# Patient Record
Sex: Male | Born: 1965 | Race: White | Hispanic: No | Marital: Single | Smoking: Current every day smoker
Health system: Southern US, Community
[De-identification: ages and names within clinical notes are randomized; demographics above are authoritative.]

## PROBLEM LIST (undated history)

## (undated) DIAGNOSIS — L0291 Cutaneous abscess, unspecified: Secondary | ICD-10-CM

## (undated) DIAGNOSIS — E119 Type 2 diabetes mellitus without complications: Secondary | ICD-10-CM

## (undated) DIAGNOSIS — M549 Dorsalgia, unspecified: Secondary | ICD-10-CM

## (undated) DIAGNOSIS — I219 Acute myocardial infarction, unspecified: Secondary | ICD-10-CM

## (undated) DIAGNOSIS — M5430 Sciatica, unspecified side: Secondary | ICD-10-CM

## (undated) HISTORY — PX: ROTATOR CUFF REPAIR: SHX139

## (undated) HISTORY — PX: CORONARY STENT PLACEMENT: SHX6853

## (undated) HISTORY — PX: TONSILLECTOMY: SUR1361

---

## 2012-08-13 ENCOUNTER — Encounter (HOSPITAL_BASED_OUTPATIENT_CLINIC_OR_DEPARTMENT_OTHER): Payer: Self-pay | Admitting: Emergency Medicine

## 2012-08-13 ENCOUNTER — Emergency Department (HOSPITAL_BASED_OUTPATIENT_CLINIC_OR_DEPARTMENT_OTHER)
Admission: EM | Admit: 2012-08-13 | Discharge: 2012-08-13 | Disposition: A | Discharge status: Home / Self Care | Payer: Medicaid Other | Attending: Emergency Medicine | Admitting: Emergency Medicine

## 2012-08-13 DIAGNOSIS — M549 Dorsalgia, unspecified: Secondary | ICD-10-CM | POA: Insufficient documentation

## 2012-08-13 DIAGNOSIS — M5416 Radiculopathy, lumbar region: Secondary | ICD-10-CM

## 2012-08-13 MED ORDER — HYDROCODONE-ACETAMINOPHEN 5-325 MG PO TABS
1.0000 | ORAL_TABLET | Freq: Once | ORAL | Status: AC
  Administered 2012-08-13: 1 via ORAL
  Filled 2012-08-13: qty 1

## 2012-08-13 MED ORDER — IBUPROFEN 600 MG PO TABS: 600.0000 mg | ORAL_TABLET | Freq: Three times a day (TID) | ORAL | Status: AC

## 2012-08-13 MED ORDER — KETOROLAC TROMETHAMINE 30 MG/ML IJ SOLN
30.0000 mg | Freq: Once | INTRAMUSCULAR | Status: AC
  Administered 2012-08-13: 30 mg via INTRAMUSCULAR
  Filled 2012-08-13: qty 1

## 2012-08-13 MED ORDER — HYDROCODONE-ACETAMINOPHEN 5-325 MG PO TABS: 2.0000 | ORAL_TABLET | Freq: Three times a day (TID) | ORAL | Status: AC | PRN

## 2012-08-13 MED ORDER — DIAZEPAM 5 MG PO TABS: 5.0000 mg | ORAL_TABLET | Freq: Two times a day (BID) | ORAL | Status: AC

## 2012-08-13 MED ORDER — DIAZEPAM 5 MG PO TABS
5.0000 mg | ORAL_TABLET | Freq: Once | ORAL | Status: AC
  Administered 2012-08-13: 5 mg via ORAL
  Filled 2012-08-13: qty 1

## 2012-08-13 NOTE — ED Notes (Signed)
Pt states that his back pain has been going for about 6 months. He states the pain radiate to his both legs.

## 2012-08-13 NOTE — ED Provider Notes (Signed)
History     CSN: [id number]  Arrival date & time 08/13/12  1024   First MD Initiated Contact with Patient 08/13/12 1059      Chief Complaint  Patient presents with  . Back Pain  . Leg Pain     HPI  the patient presents with concerns of worsening lower 70 and low back pain.  He notes a history of months of intermittent pain, prominently about the left lower back with radiation down the posterior of the left leg.  Over the past month this has become more persistent, severe.  The pain is sharp, burning, worse with weightbearing or motion.  There is minimal crossover to the right side, but this is increasing in frequency in the past days.  He denies distal dysesthesia or weakness.  No recent trauma, falls, illness. No relief with ibuprofen, ice packs, muscle relaxants History reviewed. No pertinent past medical history.  History reviewed. No pertinent past surgical history.  History reviewed. No pertinent family history.  History  Substance Use Topics  . Smoking status: Never Smoker   . Smokeless tobacco: Not on file  . Alcohol Use: 0.0 oz/week     occ      Review of Systems  Constitutional:       Per HPI, otherwise negative  HENT:       Per HPI, otherwise negative  Eyes: Negative.   Respiratory:       Per HPI, otherwise negative  Cardiovascular:       Per HPI, otherwise negative  Gastrointestinal: Negative for vomiting.  Genitourinary: Negative.   Musculoskeletal:       Per HPI, otherwise negative  Skin: Negative.   Neurological: Negative for syncope.    Allergies  Review of patient's allergies indicates no known allergies.  Home Medications  No current outpatient prescriptions on file.  BP 130/88  Pulse 66  Temp 98.4 F (36.9 C) (Oral)  Resp 18  Ht 5\' 10"  (1.778 m)  Wt 225 lb (102.059 kg)  BMI 32.28 kg/m2  SpO2 100%  Physical Exam  Nursing note and vitals reviewed. Constitutional: He is oriented to person, place, and time. He appears well-developed.  No distress.  HENT:  Head: Normocephalic and atraumatic.  Eyes: Conjunctivae and EOM are normal.  Cardiovascular: Normal rate and regular rhythm.   Pulmonary/Chest: Effort normal. No stridor. No respiratory distress.  Abdominal: He exhibits no distension.  Musculoskeletal: He exhibits no edema.       Arms:      The patient is 5/5 strength in both lower terminates, symmetric.  There is no limitation of the range of motion, distal pulses are appropriate as is distal sensation  Neurological: He is alert and oriented to person, place, and time.  Skin: Skin is warm and dry.  Psychiatric: He has a normal mood and affect.    ED Course  Procedures (including critical care time)  Labs Reviewed - No data to display No results found.   No diagnosis found.    MDM  This patient presenting with back pain, now worsening and more prominent is in no distress on my exam.  Patient has no focal neurovascular deficits, and his description of pain, worse with motion or weight-bearing, as well as his occupation that requires significant physical exertion suggestive of radiculopathy versus muscle strain as a likely etiology.  Discussed, at length, with the patient and his wife for continuing monitoring, specifically with a primary care physician and possibly an orthopedist.  We also discussed  adjunct therapy such as chiropractic care, new footwear.  The patient was discharged in stable condition.    Gerhard Munch, MD 08/13/12 [phone]

## 2012-09-02 ENCOUNTER — Encounter (HOSPITAL_BASED_OUTPATIENT_CLINIC_OR_DEPARTMENT_OTHER): Payer: Self-pay | Admitting: Emergency Medicine

## 2012-09-02 ENCOUNTER — Emergency Department (HOSPITAL_BASED_OUTPATIENT_CLINIC_OR_DEPARTMENT_OTHER)
Admission: EM | Admit: 2012-09-02 | Discharge: 2012-09-02 | Disposition: A | Discharge status: Home / Self Care | Payer: Medicaid Other | Attending: Emergency Medicine | Admitting: Emergency Medicine

## 2012-09-02 DIAGNOSIS — M545 Low back pain, unspecified: Secondary | ICD-10-CM | POA: Insufficient documentation

## 2012-09-02 DIAGNOSIS — G8929 Other chronic pain: Secondary | ICD-10-CM

## 2012-09-02 DIAGNOSIS — X500XXA Overexertion from strenuous movement or load, initial encounter: Secondary | ICD-10-CM | POA: Insufficient documentation

## 2012-09-02 MED ORDER — IBUPROFEN 800 MG PO TABS: 800.0000 mg | ORAL_TABLET | Freq: Three times a day (TID) | ORAL | Status: DC

## 2012-09-02 MED ORDER — PREDNISONE 10 MG PO TABS: ORAL_TABLET | ORAL | Status: DC

## 2012-09-02 MED ORDER — CYCLOBENZAPRINE HCL 10 MG PO TABS: 10.0000 mg | ORAL_TABLET | Freq: Two times a day (BID) | ORAL | Status: DC | PRN

## 2012-09-02 NOTE — ED Notes (Signed)
C/o of back pain with radiation to lt leg x 6 months. Seen here But no relief since- unable to make an appt with a PMD

## 2012-09-02 NOTE — ED Provider Notes (Signed)
History     CSN: [id number]  Arrival date & time 09/02/12  1137   First MD Initiated Contact with Patient 09/02/12 1206      Chief Complaint  Patient presents with  . Back Pain    (Consider location/radiation/quality/duration/timing/severity/associated sxs/prior treatment) Patient is a 46 y.o. male presenting with back pain. The history is provided by the patient. No language interpreter was used.  Back Pain  This is a chronic problem. The current episode started more than 1 week ago (6 months ago). The problem occurs constantly. The problem has been gradually worsening. The pain is associated with lifting heavy objects. The pain is present in the lumbar spine. The quality of the pain is described as burning and aching. The pain radiates to the left thigh. The pain is at a severity of 7/10. The pain is moderate. The symptoms are aggravated by bending, twisting and certain positions. The pain is worse during the day. Stiffness is present all day. Associated symptoms include numbness and leg pain. Pertinent negatives include no chest pain, no fever, no weight loss, no headaches, no abdominal pain, no abdominal swelling, no bowel incontinence, no perianal numbness, no bladder incontinence, no dysuria, no pelvic pain, no paresthesias, no paresis, no tingling and no weakness. He has tried NSAIDs for the symptoms. The treatment provided mild relief.    History reviewed. No pertinent past medical history.  History reviewed. No pertinent past surgical history.  No family history on file.  History  Substance Use Topics  . Smoking status: Never Smoker   . Smokeless tobacco: Not on file  . Alcohol Use: 0.0 oz/week     occ      Review of Systems  Constitutional: Negative for fever and weight loss.  HENT: Negative.   Eyes: Negative.   Respiratory: Negative.   Cardiovascular: Negative for chest pain.  Gastrointestinal: Negative.  Negative for abdominal pain and bowel incontinence.    Genitourinary: Negative.  Negative for bladder incontinence, dysuria and pelvic pain.  Musculoskeletal: Positive for back pain.  Skin: Negative.   Neurological: Positive for numbness. Negative for tingling, weakness, headaches and paresthesias.  Hematological: Negative.   Psychiatric/Behavioral: Negative.   All other systems reviewed and are negative.    Allergies  Review of patient's allergies indicates no known allergies.  Home Medications  No current outpatient prescriptions on file.  BP 131/83  Pulse 76  Temp 97.4 F (36.3 C) (Oral)  Resp 20  SpO2 99%  Physical Exam  Nursing note and vitals reviewed. Constitutional: He is oriented to person, place, and time. He appears well-developed and well-nourished.  HENT:  Head: Normocephalic.  Eyes: Pupils are equal, round, and reactive to light.  Neck: Normal range of motion.  Cardiovascular: Normal rate, regular rhythm and normal heart sounds.   Pulmonary/Chest: Effort normal and breath sounds normal.  Abdominal: Soft. Bowel sounds are normal.  Musculoskeletal:       Left low back pain with palpation over the left SI and buttock, ROM 5/5, Strength 4/5, reflexes 2+  Neurological: He is alert and oriented to person, place, and time.  Skin: Skin is warm and dry.  Psychiatric: He has a normal mood and affect. His behavior is normal.    ED Course  Procedures (including critical care time)  Labs Reviewed - No data to display No results found.   1. Chronic back pain       MDM  Patient with chronic back pain, possibly sciatica, will treat with steroids and refer  to PCP for outpatient PT referral.        Roxy Horseman, PA-C 09/02/12 1237  Roxy Horseman, PA-C 09/02/12 1242

## 2012-09-03 NOTE — ED Provider Notes (Signed)
Medical screening examination/treatment/procedure(s) were performed by non-physician practitioner and as supervising physician I was immediately available for consultation/collaboration.  Michale Emmerich, MD 09/03/12 0704 

## 2014-07-06 DIAGNOSIS — M542 Cervicalgia: Secondary | ICD-10-CM | POA: Insufficient documentation

## 2014-07-06 DIAGNOSIS — M545 Low back pain, unspecified: Secondary | ICD-10-CM | POA: Insufficient documentation

## 2014-08-17 DIAGNOSIS — M5136 Other intervertebral disc degeneration, lumbar region: Secondary | ICD-10-CM | POA: Insufficient documentation

## 2015-01-17 DIAGNOSIS — N486 Induration penis plastica: Secondary | ICD-10-CM | POA: Insufficient documentation

## 2015-08-30 DIAGNOSIS — F419 Anxiety disorder, unspecified: Secondary | ICD-10-CM | POA: Insufficient documentation

## 2016-08-01 ENCOUNTER — Emergency Department (HOSPITAL_BASED_OUTPATIENT_CLINIC_OR_DEPARTMENT_OTHER)
Admission: EM | Admit: 2016-08-01 | Discharge: 2016-08-01 | Disposition: A | Discharge status: Home / Self Care | Payer: Medicaid Other | Attending: Emergency Medicine | Admitting: Emergency Medicine

## 2016-08-01 ENCOUNTER — Encounter (HOSPITAL_BASED_OUTPATIENT_CLINIC_OR_DEPARTMENT_OTHER): Payer: Self-pay

## 2016-08-01 DIAGNOSIS — Z79899 Other long term (current) drug therapy: Secondary | ICD-10-CM | POA: Diagnosis not present

## 2016-08-01 DIAGNOSIS — H6093 Unspecified otitis externa, bilateral: Secondary | ICD-10-CM | POA: Diagnosis not present

## 2016-08-01 DIAGNOSIS — H9201 Otalgia, right ear: Secondary | ICD-10-CM | POA: Diagnosis present

## 2016-08-01 HISTORY — DX: Sciatica, unspecified side: M54.30

## 2016-08-01 HISTORY — DX: Dorsalgia, unspecified: M54.9

## 2016-08-01 MED ORDER — NEOMYCIN-POLYMYXIN-HC 3.5-10000-1 OT SUSP: 5.0000 [drp] | Freq: Four times a day (QID) | OTIC | 0 refills | Status: AC

## 2016-08-01 NOTE — ED Provider Notes (Signed)
[facility]   CSN: [id number] Arrival date & time: 08/01/16  1612  By signing my name below, I, Tony Patel, attest that this documentation has been prepared under the direction and in the presence of Tony Joy, PA-C. Electronically Signed: Angelene GiovanniEmmanuella Patel, ED Scribe. 08/01/16. 4:36 PM.    History   Chief Complaint Chief Complaint  Patient presents with  . Otalgia    HPI Comments: Tony SaxonCurtis Ehrler Jr. is a 50 y.o. male who presents to the Emergency Department complaining of ongoing moderate bilateral otalgia, worse on the right onset several days ago. He explains that he has been to the pool a lot lately. No alleviating factors noted. Patient states, "It feels like I have swimmer's ear." Pt has not tried any medications PTA. He denies any hearing changes, fever, chills, generalized rash, or any other complaints.    The history is provided by the patient. No language interpreter was used.    Past Medical History:  Diagnosis Date  . Back pain   . Sciatica     There are no active problems to display for this patient.   History reviewed. No pertinent surgical history.     Home Medications    Prior to Admission medications   Medication Sig Start Date End Date Taking? Authorizing Provider  HYDROcodone-acetaminophen (NORCO) 7.5-325 MG tablet Take 1 tablet by mouth every 6 (six) hours as needed for moderate pain.   Yes Historical Provider, MD  ibuprofen (ADVIL,MOTRIN) 800 MG tablet Take 1 tablet (800 mg total) by mouth 3 (three) times daily. 09/02/12   Roxy Horsemanobert Browning, PA-C  neomycin-polymyxin-hydrocortisone (CORTISPORIN) 3.5-10000-1 otic suspension Place 5 drops into both ears 4 (four) times daily. 08/01/16 08/08/16  Anselm PancoastShawn C Joy, PA-C    Family History No family history on file.  Social History Social History  Substance Use Topics  . Smoking status: Never Smoker  . Smokeless tobacco: Never Used  . Alcohol use 0.0 oz/week     Comment: daily       Allergies   Review of patient's allergies indicates no known allergies.   Review of Systems Review of Systems  Constitutional: Negative for chills and fever.  HENT: Positive for ear pain. Negative for ear discharge and hearing loss.   Skin: Negative for rash.     Physical Exam Updated Vital Signs BP 129/93   Pulse 79   Temp 98.1 F (36.7 C)   Resp 16   Ht 5\' 10"  (1.778 m)   Wt 206 lb (93.4 kg)   SpO2 96%   BMI 29.56 kg/m   Physical Exam  Constitutional: He appears well-developed and well-nourished. No distress.  HENT:  Head: Normocephalic and atraumatic.  Both ear canals erythematous with distribution of white debris, more in the right than the left. Some cerumen buildup bilaterally. Right ear: pain with manipulation of the auricle and tragus.   Eyes: Conjunctivae are normal.  Neck: Neck supple.  Cardiovascular: Normal rate and regular rhythm.   Pulmonary/Chest: Effort normal.  Neurological: He is alert.  Skin: Skin is warm and dry. He is not diaphoretic.  Psychiatric: He has a normal mood and affect. His behavior is normal.  Nursing note and vitals reviewed.    ED Treatments / Results  DIAGNOSTIC STUDIES: Oxygen Saturation is 96% on RA, adequate by my interpretation.    COORDINATION OF CARE: 4:28 PM- Pt advised of plan for treatment and pt agrees. Pt will receive for ear drops for pain relief.   Procedures Procedures (including  critical care time)  Medications Ordered in ED Medications - No data to display   Initial Impression / Assessment and Plan / ED Course  Harolyn RutherfordShawn Joy, PA-C has reviewed the triage vital signs and the nursing notes.  Pertinent labs & imaging results that were available during my care of the patient were reviewed by me and considered in my medical decision making (see chart for details).  Clinical Course    Tony SaxonCurtis Bienaime Jr. presents with otalgia for the last several days.  Suspect otitis externa. No systemic symptoms.  Prescription given. The patient was given instructions for home care as well as return precautions. Patient voices understanding of these instructions, accepts the plan, and is comfortable with discharge.   Final Clinical Impressions(s) / ED Diagnoses   Final diagnoses:  Otitis externa, bilateral   I personally performed the services described in this documentation, which was scribed in my presence. The recorded information has been reviewed and is accurate.    New Prescriptions New Prescriptions   NEOMYCIN-POLYMYXIN-HYDROCORTISONE (CORTISPORIN) 3.5-10000-1 OTIC SUSPENSION    Place 5 drops into both ears 4 (four) times daily.     Anselm PancoastShawn C Joy, PA-C 08/01/16 1645    Tony MemosJason Mesner, MD 08/01/16 2053

## 2016-08-01 NOTE — ED Triage Notes (Signed)
Right earache x 3 days-NAD-steady gait

## 2016-08-01 NOTE — ED Notes (Signed)
PA at bedside.

## 2016-08-01 NOTE — Discharge Instructions (Signed)
You have been seen today for ear pain. The physical exam findings are consistent with otitis externa, also known as swimmer's ear. Keep the ears clean and dry. After swimming, be sure to dry the ears completely and soon after you leave the water. Do not stick anything into the ears. Refer to the attached instructions for more detailed information. You will also see instructions regarding earwax buildup. You do not have a cerumen impaction at this time, rather this information sheet is included to help you with care for wax buildup. Follow up with PCP as needed should symptoms continue. Return to ED should symptoms worsen.

## 2016-10-10 ENCOUNTER — Emergency Department (HOSPITAL_BASED_OUTPATIENT_CLINIC_OR_DEPARTMENT_OTHER): Payer: Medicaid Other

## 2016-10-10 ENCOUNTER — Emergency Department (HOSPITAL_BASED_OUTPATIENT_CLINIC_OR_DEPARTMENT_OTHER)
Admission: EM | Admit: 2016-10-10 | Discharge: 2016-10-10 | Disposition: A | Discharge status: Home / Self Care | Payer: Medicaid Other | Attending: Emergency Medicine | Admitting: Emergency Medicine

## 2016-10-10 ENCOUNTER — Encounter (HOSPITAL_BASED_OUTPATIENT_CLINIC_OR_DEPARTMENT_OTHER): Payer: Self-pay

## 2016-10-10 DIAGNOSIS — S62610A Displaced fracture of proximal phalanx of right index finger, initial encounter for closed fracture: Secondary | ICD-10-CM | POA: Insufficient documentation

## 2016-10-10 DIAGNOSIS — Y939 Activity, unspecified: Secondary | ICD-10-CM | POA: Insufficient documentation

## 2016-10-10 DIAGNOSIS — Y999 Unspecified external cause status: Secondary | ICD-10-CM | POA: Diagnosis not present

## 2016-10-10 DIAGNOSIS — W230XXA Caught, crushed, jammed, or pinched between moving objects, initial encounter: Secondary | ICD-10-CM | POA: Diagnosis not present

## 2016-10-10 DIAGNOSIS — S6991XA Unspecified injury of right wrist, hand and finger(s), initial encounter: Secondary | ICD-10-CM | POA: Diagnosis present

## 2016-10-10 DIAGNOSIS — F17200 Nicotine dependence, unspecified, uncomplicated: Secondary | ICD-10-CM | POA: Insufficient documentation

## 2016-10-10 DIAGNOSIS — Y929 Unspecified place or not applicable: Secondary | ICD-10-CM | POA: Insufficient documentation

## 2016-10-10 DIAGNOSIS — S62619A Displaced fracture of proximal phalanx of unspecified finger, initial encounter for closed fracture: Secondary | ICD-10-CM

## 2016-10-10 MED ORDER — TRAMADOL HCL 50 MG PO TABS: 50.0000 mg | ORAL_TABLET | Freq: Four times a day (QID) | ORAL | 0 refills | Status: DC | PRN

## 2016-10-10 MED ORDER — IBUPROFEN 800 MG PO TABS: 800.0000 mg | ORAL_TABLET | Freq: Three times a day (TID) | ORAL | 0 refills | Status: DC | PRN

## 2016-10-10 MED FILL — IBUPROFEN 800 MG TABLET: 800 | 7 days supply | Qty: 21 | Fill #0
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
## 2016-10-10 NOTE — Discharge Instructions (Signed)
Return here as needed. Follow up with the hand surgeon provided.  °

## 2016-10-10 NOTE — ED Notes (Signed)
PA at bedside.

## 2017-01-28 ENCOUNTER — Emergency Department (HOSPITAL_BASED_OUTPATIENT_CLINIC_OR_DEPARTMENT_OTHER)
Admission: EM | Admit: 2017-01-28 | Discharge: 2017-01-28 | Disposition: A | Discharge status: Home / Self Care | Payer: Medicaid Other | Attending: Emergency Medicine | Admitting: Emergency Medicine

## 2017-01-28 ENCOUNTER — Encounter (HOSPITAL_BASED_OUTPATIENT_CLINIC_OR_DEPARTMENT_OTHER): Payer: Self-pay | Admitting: Emergency Medicine

## 2017-01-28 DIAGNOSIS — L0231 Cutaneous abscess of buttock: Secondary | ICD-10-CM | POA: Insufficient documentation

## 2017-01-28 DIAGNOSIS — F17200 Nicotine dependence, unspecified, uncomplicated: Secondary | ICD-10-CM | POA: Diagnosis not present

## 2017-01-28 DIAGNOSIS — E119 Type 2 diabetes mellitus without complications: Secondary | ICD-10-CM | POA: Diagnosis not present

## 2017-01-28 DIAGNOSIS — Z7984 Long term (current) use of oral hypoglycemic drugs: Secondary | ICD-10-CM | POA: Insufficient documentation

## 2017-01-28 DIAGNOSIS — L0291 Cutaneous abscess, unspecified: Secondary | ICD-10-CM

## 2017-01-28 HISTORY — DX: Type 2 diabetes mellitus without complications: E11.9

## 2017-01-28 MED ORDER — DOXYCYCLINE HYCLATE 100 MG PO CAPS: 100.0000 mg | ORAL_CAPSULE | Freq: Two times a day (BID) | ORAL | 0 refills | Status: DC

## 2017-01-28 MED ORDER — IBUPROFEN 800 MG PO TABS
800.0000 mg | ORAL_TABLET | Freq: Once | ORAL | Status: AC
  Administered 2017-01-28: 800 mg via ORAL
  Filled 2017-01-28: qty 1

## 2017-01-28 MED ORDER — LIDOCAINE-EPINEPHRINE 1 %-1:100000 IJ SOLN
INTRAMUSCULAR | Status: AC
  Administered 2017-01-28: 1 mL
  Filled 2017-01-28: qty 1

## 2017-01-28 MED ORDER — LIDOCAINE-EPINEPHRINE 2 %-1:100000 IJ SOLN: 20.0000 mL | Freq: Once | INTRAMUSCULAR | Status: DC

## 2017-01-28 MED ORDER — ACETAMINOPHEN 500 MG PO TABS
1000.0000 mg | ORAL_TABLET | Freq: Once | ORAL | Status: AC
  Administered 2017-01-28: 1000 mg via ORAL
  Filled 2017-01-28: qty 2

## 2017-01-28 MED FILL — DOXYCYCLINE HYC 100 MG CAP: 100 | 7 days supply | Qty: 14 | Fill #0

## 2017-01-28 NOTE — ED Provider Notes (Signed)
INCISION AND DRAINAGE Performed by: Carlyle DollyLAWYER,Mozell Hardacre W Consent: Verbal consent obtained. Risks and benefits: risks, benefits and alternatives were discussed Type: abscess  Body area:Superior medial R buttocks  Anesthesia: local infiltration  Incision was made with a scalpel.  Local anesthetic: lidocaine 2% w epinephrine  Anesthetic total: 8 ml  Complexity: complex Blunt dissection to break up loculations  Drainage: purulent  Drainage amount: large  Packing material: 1/4 in iodoform gauze  Patient tolerance: Patient tolerated the procedure well with no immediate complications.      Charlestine Nighthristopher Akansha Wyche, PA-C 01/28/17 1019    Melene Planan Floyd, DO 01/28/17 1533

## 2017-01-28 NOTE — ED Triage Notes (Signed)
Patient reports abscess to right buttock x3-4 days.  Reports he previously had an abscess in same location 2 weeks ago which he opened and states it went away but this time abscess is much larger.

## 2017-01-28 NOTE — ED Provider Notes (Signed)
[facility] Provider Note   CSN: [id number] Arrival date & time: 01/28/17  0910     History   Chief Complaint Chief Complaint  Patient presents with  . Abscess    HPI Tony SaxonCurtis Palleschi Jr. is a 51 y.o. male.  51 yo M with a chief complaint of a abscess to his buttock. This been going on for about a week. He noticed a pimple there and popped it and then it has gotten worse. Has been also having upper respiratory infection like symptoms. Cough and congestion general aches.   The history is provided by the patient.  Abscess  Location:  Pelvis Pelvic abscess location:  Gluteal cleft Size:  3 Abscess quality: induration, painful and redness   Red streaking: no   Duration:  5 days Progression:  Worsening Pain details:    Quality:  Sharp and shooting   Severity:  Moderate   Timing:  Constant   Progression:  Unchanged Chronicity:  New Context: not diabetes, not immunosuppression and not skin injury   Relieved by:  Nothing Worsened by:  Nothing Ineffective treatments:  None tried Associated symptoms: no fever, no headaches and no vomiting     Past Medical History:  Diagnosis Date  . Back pain   . Diabetes mellitus without complication (HCC)   . Sciatica     There are no active problems to display for this patient.   History reviewed. No pertinent surgical history.     Home Medications    Prior to Admission medications   Medication Sig Start Date End Date Taking? Authorizing Provider  METFORMIN HCL PO Take by mouth.   Yes Historical Provider, MD  doxycycline (VIBRAMYCIN) 100 MG capsule Take 1 capsule (100 mg total) by mouth 2 (two) times daily. One po bid x 7 days 01/28/17   Melene Planan Erricka Falkner, DO  HYDROcodone-acetaminophen (NORCO) 7.5-325 MG tablet Take 1 tablet by mouth every 6 (six) hours as needed for moderate pain.    Historical Provider, MD  ibuprofen (ADVIL,MOTRIN) 800 MG tablet Take 1 tablet (800 mg total) by mouth every 8 (eight) hours as needed. 10/10/16    Charlestine Nighthristopher Lawyer, PA-C  traMADol (ULTRAM) 50 MG tablet Take 1 tablet (50 mg total) by mouth every 6 (six) hours as needed for severe pain. 10/10/16   Charlestine Nighthristopher Lawyer, PA-C    Family History History reviewed. No pertinent family history.  Social History Social History  Substance Use Topics  . Smoking status: Current Every Day Smoker    Packs/day: 0.50  . Smokeless tobacco: Never Used  . Alcohol use 0.0 oz/week     Comment: weekly     Allergies   Patient has no known allergies.   Review of Systems Review of Systems  Constitutional: Negative for chills and fever.  HENT: Negative for congestion and facial swelling.   Eyes: Negative for discharge and visual disturbance.  Respiratory: Negative for shortness of breath.   Cardiovascular: Negative for chest pain and palpitations.  Gastrointestinal: Negative for abdominal pain, diarrhea and vomiting.  Musculoskeletal: Negative for arthralgias and myalgias.  Skin: Positive for color change and wound. Negative for rash.  Neurological: Negative for tremors, syncope and headaches.  Psychiatric/Behavioral: Negative for confusion and dysphoric mood.     Physical Exam Updated Vital Signs BP 140/83 (BP Location: Right Arm)   Pulse 109   Temp 99.1 F (37.3 C) (Oral)   Resp 16   Ht 5\' 10"  (1.778 m)   Wt 215 lb (97.5 kg)   SpO2  99%   BMI 30.85 kg/m   Physical Exam  Constitutional: He is oriented to person, place, and time. He appears well-developed and well-nourished.  HENT:  Head: Normocephalic and atraumatic.  Eyes: EOM are normal. Pupils are equal, round, and reactive to light.  Neck: Normal range of motion. Neck supple. No JVD present.  Cardiovascular: Normal rate and regular rhythm.  Exam reveals no gallop and no friction rub.   No murmur heard. Pulmonary/Chest: No respiratory distress. He has no wheezes.  Abdominal: He exhibits no distension. There is no rebound and no guarding.  Genitourinary:  Genitourinary  Comments: Area of erythema and induration. There is a there is been brought to the head on the gluteal cleft. Is on the left buttock.  Musculoskeletal: Normal range of motion.  Neurological: He is alert and oriented to person, place, and time.  Skin: No rash noted. No pallor.  Psychiatric: He has a normal mood and affect. His behavior is normal.  Nursing note and vitals reviewed.    ED Treatments / Results  Labs (all labs ordered are listed, but only abnormal results are displayed) Labs Reviewed - No data to display  EKG  EKG Interpretation None       Radiology No results found.  Procedures Procedures (including critical care time)  Medications Ordered in ED Medications  lidocaine-EPINEPHrine (XYLOCAINE W/EPI) 2 %-1:100000 (with pres) injection 20 mL (not administered)  acetaminophen (TYLENOL) tablet 1,000 mg (1,000 mg Oral Given 01/28/17 0938)  ibuprofen (ADVIL,MOTRIN) tablet 800 mg (800 mg Oral Given 01/28/17 0938)  lidocaine-EPINEPHrine (XYLOCAINE W/EPI) 1 %-1:100000 (with pres) injection (1 mL  Given by Other 01/28/17 1610)     Initial Impression / Assessment and Plan / ED Course  I have reviewed the triage vital signs and the nursing notes.  Pertinent labs & imaging results that were available during my care of the patient were reviewed by me and considered in my medical decision making (see chart for details).     51 yo M With a chief complaint of an abscess to his buttock. He also has an ongoing upper respiratory infection. Will do an I&D at bedside.  10:19 AM:  I have discussed the diagnosis/risks/treatment options with the patient and family and believe the pt to be eligible for discharge home to follow-up with PCP. We also discussed returning to the ED immediately if new or worsening sx occur. We discussed the sx which are most concerning (e.g., sudden worsening pain, fever, inability to tolerate by mouth) that necessitate immediate return. Medications administered  to the patient during their visit and any new prescriptions provided to the patient are listed below.  Medications given during this visit Medications  lidocaine-EPINEPHrine (XYLOCAINE W/EPI) 2 %-1:100000 (with pres) injection 20 mL (not administered)  acetaminophen (TYLENOL) tablet 1,000 mg (1,000 mg Oral Given 01/28/17 0938)  ibuprofen (ADVIL,MOTRIN) tablet 800 mg (800 mg Oral Given 01/28/17 0938)  lidocaine-EPINEPHrine (XYLOCAINE W/EPI) 1 %-1:100000 (with pres) injection (1 mL  Given by Other 01/28/17 9604)     The patient appears reasonably screen and/or stabilized for discharge and I doubt any other medical condition or other [facility] requiring further screening, evaluation, or treatment in the ED at this time prior to discharge.    Final Clinical Impressions(s) / ED Diagnoses   Final diagnoses:  Abscess    New Prescriptions New Prescriptions   DOXYCYCLINE (VIBRAMYCIN) 100 MG CAPSULE    Take 1 capsule (100 mg total) by mouth 2 (two) times daily. One po bid x  7 days     Melene Plan, DO 01/28/17 1019

## 2017-01-28 NOTE — Discharge Instructions (Signed)
Warm compresses 4 times a day. Return for fevers vomiting.

## 2017-01-30 ENCOUNTER — Encounter (HOSPITAL_BASED_OUTPATIENT_CLINIC_OR_DEPARTMENT_OTHER): Payer: Self-pay | Admitting: Emergency Medicine

## 2017-01-30 ENCOUNTER — Emergency Department (HOSPITAL_BASED_OUTPATIENT_CLINIC_OR_DEPARTMENT_OTHER): Payer: Medicaid Other

## 2017-01-30 ENCOUNTER — Emergency Department (HOSPITAL_BASED_OUTPATIENT_CLINIC_OR_DEPARTMENT_OTHER)
Admission: EM | Admit: 2017-01-30 | Discharge: 2017-01-30 | Disposition: A | Discharge status: Home / Self Care | Payer: Medicaid Other | Attending: Emergency Medicine | Admitting: Emergency Medicine

## 2017-01-30 DIAGNOSIS — Z7984 Long term (current) use of oral hypoglycemic drugs: Secondary | ICD-10-CM | POA: Diagnosis not present

## 2017-01-30 DIAGNOSIS — R739 Hyperglycemia, unspecified: Secondary | ICD-10-CM

## 2017-01-30 DIAGNOSIS — L03317 Cellulitis of buttock: Secondary | ICD-10-CM

## 2017-01-30 DIAGNOSIS — L0231 Cutaneous abscess of buttock: Secondary | ICD-10-CM | POA: Insufficient documentation

## 2017-01-30 DIAGNOSIS — F17200 Nicotine dependence, unspecified, uncomplicated: Secondary | ICD-10-CM | POA: Diagnosis not present

## 2017-01-30 DIAGNOSIS — E1165 Type 2 diabetes mellitus with hyperglycemia: Secondary | ICD-10-CM | POA: Insufficient documentation

## 2017-01-30 LAB — I-STAT CHEM 8, ED
BUN: 14 mg/dL (ref 6–20)
CALCIUM ION: 1.25 mmol/L (ref 1.15–1.40)
CHLORIDE: 98 mmol/L — AB (ref 101–111)
Creatinine, Ser: 0.7 mg/dL (ref 0.61–1.24)
GLUCOSE: 401 mg/dL — AB (ref 65–99)
HCT: 43 % (ref 39.0–52.0)
Hemoglobin: 14.6 g/dL (ref 13.0–17.0)
POTASSIUM: 3.9 mmol/L (ref 3.5–5.1)
SODIUM: 135 mmol/L (ref 135–145)
TCO2: 24 mmol/L (ref 0–100)

## 2017-01-30 LAB — CBG MONITORING, ED: Glucose-Capillary: 210 mg/dL — ABNORMAL HIGH (ref 65–99)

## 2017-01-30 MED ORDER — SODIUM CHLORIDE 0.9 % IV BOLUS (SEPSIS)
2000.0000 mL | Freq: Once | INTRAVENOUS | Status: AC
  Administered 2017-01-30: 2000 mL via INTRAVENOUS

## 2017-01-30 MED ORDER — VANCOMYCIN HCL IN DEXTROSE 1-5 GM/200ML-% IV SOLN
1000.0000 mg | Freq: Once | INTRAVENOUS | Status: AC
  Administered 2017-01-30: 1000 mg via INTRAVENOUS
  Filled 2017-01-30: qty 200

## 2017-01-30 MED ORDER — CLINDAMYCIN HCL 300 MG PO CAPS: 300.0000 mg | ORAL_CAPSULE | Freq: Four times a day (QID) | ORAL | 0 refills | Status: DC

## 2017-01-30 MED ORDER — IOPAMIDOL (ISOVUE-300) INJECTION 61%
100.0000 mL | Freq: Once | INTRAVENOUS | Status: AC | PRN
  Administered 2017-01-30: 100 mL via INTRAVENOUS

## 2017-01-30 MED ORDER — INSULIN ASPART 100 UNIT/ML IV SOLN
10.0000 [IU] | Freq: Once | INTRAVENOUS | Status: AC
  Administered 2017-01-30: 10 [IU] via INTRAVENOUS
  Filled 2017-01-30: qty 1

## 2017-01-30 MED ORDER — KETOROLAC TROMETHAMINE 30 MG/ML IJ SOLN
30.0000 mg | Freq: Once | INTRAMUSCULAR | Status: AC
  Administered 2017-01-30: 30 mg via INTRAVENOUS
  Filled 2017-01-30: qty 1

## 2017-01-30 MED ORDER — HYDROCODONE-ACETAMINOPHEN 5-325 MG PO TABS: 1.0000 | ORAL_TABLET | ORAL | 0 refills | Status: DC | PRN

## 2017-01-30 MED FILL — CLINDAMYCIN HCL 300 MG CAPS: 300 | 7 days supply | Qty: 28 | Fill #0

## 2017-01-30 NOTE — Discharge Instructions (Signed)
Return tomorrow morning for reevaluation of infection. Return immediately for any worsening pain, fever or for any concerns.

## 2017-01-30 NOTE — ED Provider Notes (Signed)
[facility]   CSN: [id number] Arrival date & time: 01/30/17  7829     History   Chief Complaint Chief Complaint  Patient presents with  . Wound Check    HPI Tony Patel. is a 51 y.o. male.  HPI Patient was evaluated for perirectal abscess 2 days ago. Had incision and drainage with packing placed. Started on doxycycline. Patient states she's been compliant with his medications.  His pain medication this morning. This prompted his visit today. He is having increased pain in the region. No fever or chills. No abdominal pain. No nausea or vomiting. Past Medical History:  Diagnosis Date  . Back pain   . Diabetes mellitus without complication (HCC)   . Sciatica     There are no active problems to display for this patient.   No past surgical history on file.     Home Medications    Prior to Admission medications   Medication Sig Start Date End Date Taking? Authorizing Provider  clindamycin (CLEOCIN) 300 MG capsule Take 1 capsule (300 mg total) by mouth 4 (four) times daily. X 7 days 01/30/17   Loren Racer, MD  HYDROcodone-acetaminophen [facility]) 5-325 MG tablet Take 1-2 tablets by mouth every 4 (four) hours as needed for severe pain. 01/30/17   Loren Racer, MD  ibuprofen (ADVIL,MOTRIN) 800 MG tablet Take 1 tablet (800 mg total) by mouth every 8 (eight) hours as needed. 10/10/16   Christopher Lawyer, PA-C  METFORMIN HCL PO Take by mouth.    Historical Provider, MD  traMADol (ULTRAM) 50 MG tablet Take 1 tablet (50 mg total) by mouth every 6 (six) hours as needed for severe pain. 10/10/16   Charlestine Night, PA-C    Family History No family history on file.  Social History Social History  Substance Use Topics  . Smoking status: Current Every Day Smoker    Packs/day: 0.50  . Smokeless tobacco: Never Used  . Alcohol use 0.0 oz/week     Comment: weekly     Allergies   Patient has no known allergies.   Review of Systems Review  of Systems  Constitutional: Negative for chills, fatigue and fever.  Respiratory: Negative for cough and shortness of breath.   Cardiovascular: Negative for chest pain.  Gastrointestinal: Negative for abdominal pain, constipation, diarrhea, nausea and vomiting.  Genitourinary: Negative for dysuria, flank pain, frequency and hematuria.  Musculoskeletal: Negative for back pain, myalgias, neck pain and neck stiffness.  Skin: Positive for wound. Negative for rash.  Neurological: Negative for dizziness, weakness, light-headedness, numbness and headaches.  All other systems reviewed and are negative.    Physical Exam Updated Vital Signs BP 119/89 (BP Location: Left Arm)   Pulse 68   Temp 97.9 F (36.6 C) (Oral)   Resp 16   Ht 5\' 10"  (1.778 m)   Wt 215 lb (97.5 kg)   SpO2 98%   BMI 30.85 kg/m   Physical Exam  Constitutional: He is oriented to person, place, and time. He appears well-developed and well-nourished.  HENT:  Head: Normocephalic and atraumatic.  Mouth/Throat: Oropharynx is clear and moist.  Eyes: EOM are normal. Pupils are equal, round, and reactive to light.  Neck: Normal range of motion. Neck supple.  Cardiovascular: Normal rate and regular rhythm.   Pulmonary/Chest: Effort normal and breath sounds normal.  Abdominal: Soft. Bowel sounds are normal. There is no tenderness. There is no rebound and no guarding.  Genitourinary:  Genitourinary Comments: Patient with open wound just  superior to the anus on the right. Packing was removed. Small amount of purulent material expressed. Patient has induration around the site of the incision. No appreciated masses or warmth.  Musculoskeletal: Normal range of motion. He exhibits no edema or tenderness.  Neurological: He is alert and oriented to person, place, and time.  Patient is slurring words. Appears drowsy. Moving all changes without deficit. Sensation intact. Ambulates without assistance.  Skin: Skin is warm and dry. Capillary  refill takes less than 2 seconds. No rash noted. No erythema.  Psychiatric: He has a normal mood and affect. His behavior is normal.  Nursing note and vitals reviewed.    ED Treatments / Results  Labs (all labs ordered are listed, but only abnormal results are displayed) Labs Reviewed  I-STAT CHEM 8, ED - Abnormal; Notable for the following:       Result Value   Chloride 98 (*)    Glucose, Bld 401 (*)    All other components within normal limits  CBG MONITORING, ED - Abnormal; Notable for the following:    Glucose-Capillary 210 (*)    All other components within normal limits  AEROBIC/ANAEROBIC CULTURE (SURGICAL/DEEP WOUND)    EKG  EKG Interpretation None       Radiology Ct Pelvis W Contrast  Result Date: 01/30/2017 CLINICAL DATA:  Evaluate for perirectal abscess. EXAM: CT PELVIS WITH CONTRAST TECHNIQUE: Multidetector CT imaging of the pelvis was performed using the standard protocol following the bolus administration of intravenous contrast. CONTRAST:  100mL ISOVUE-300 IOPAMIDOL (ISOVUE-300) INJECTION 61% COMPARISON:  None. FINDINGS: Urinary Tract:  No abnormality visualized. Bowel:  Unremarkable visualized pelvic bowel loops. Vascular/Lymphatic: Normal appearance of the distal aorta and iliac vessels. Right external iliac node is prominent measuring 11 mm, image 82 of series 2. Prominent right inguinal lymph nodes are also identified. These measure up to 9 mm, image 99 of series 2. I adjacent right inguinal lymph node measures 1 cm, image 96 of series 2. Reproductive:  No mass or other significant abnormality Other: The right gluteal fall appears diffusely edematous with subcutaneous fat stranding and skin thickening. Just below this scan is a small fluid collection containing gas measuring 1.4 x 1.1 cm, image 138 of series 2. Musculoskeletal: There is accentuation of trabecular markings and cortical thinning involving the right iliac bone. IMPRESSION: 1. Cellulitis of the right  gluteal fold with small subcutaneous gas and fluid collection compatible with small residual abscess. 2. 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 3. Prominent right external iliac and right inguinal lymph nodes are likely reactive. Electronically Signed   By: Signa Kellaylor  Stroud M.D.   On: 01/30/2017 10:08    Procedures Procedures (including critical care time)  Medications Ordered in ED Medications  iopamidol (ISOVUE-300) 61 % injection 100 mL (100 mLs Intravenous Contrast Given 01/30/17 0939)  sodium chloride 0.9 % bolus 2,000 mL (2,000 mLs Intravenous New Bag/Given 01/30/17 1042)  insulin aspart (novoLOG) injection 10 Units (10 Units Intravenous Given 01/30/17 1043)  vancomycin (VANCOCIN) IVPB 1000 mg/200 mL premix (1,000 mg Intravenous New Bag/Given 01/30/17 1043)  ketorolac (TORADOL) 30 MG/ML injection 30 mg (30 mg Intravenous Given 01/30/17 1120)     Initial Impression / Assessment and Plan / ED Course  I have reviewed the triage vital signs and the nursing notes.  Pertinent labs & imaging results that were available during my care of the patient were reviewed by me and considered in my medical decision making (see chart for details).  Given IV vancomycin, insulin and normal saline. CT with evidence of cellulitis. Small amount of residual fluid at abscess site. Culture taken. Discussed admission with patient. Advised admission given outpatient antibiotic failure and uncontrolled hyperglycemia. Patient does not want to be admitted. States he will return tomorrow to be reevaluated and possibly receive repeat IV antibiotics if needed. If symptoms are worsening he'll need to be admitted at this point. Repeat glucose is 210. Patient's much more awake. Vital signs remained stable.  Final Clinical Impressions(s) / ED Diagnoses   Final diagnoses:  Cellulitis and abscess of buttock  Hyperglycemia    New Prescriptions New Prescriptions   CLINDAMYCIN (CLEOCIN) 300 MG CAPSULE     Take 1 capsule (300 mg total) by mouth 4 (four) times daily. X 7 days   HYDROCODONE-ACETAMINOPHEN (NORCO) 5-325 MG TABLET    Take 1-2 tablets by mouth every 4 (four) hours as needed for severe pain.     Loren Racer, MD 01/30/17 [phone]

## 2017-01-30 NOTE — ED Triage Notes (Signed)
Pt here for wound check.  Pt states he is out of pain medication.  Speech slurred

## 2017-01-31 ENCOUNTER — Encounter (HOSPITAL_BASED_OUTPATIENT_CLINIC_OR_DEPARTMENT_OTHER): Payer: Self-pay | Admitting: Emergency Medicine

## 2017-01-31 ENCOUNTER — Emergency Department (HOSPITAL_BASED_OUTPATIENT_CLINIC_OR_DEPARTMENT_OTHER)
Admission: EM | Admit: 2017-01-31 | Discharge: 2017-01-31 | Disposition: A | Discharge status: Home / Self Care | Payer: Medicaid Other | Attending: Emergency Medicine | Admitting: Emergency Medicine

## 2017-01-31 DIAGNOSIS — E119 Type 2 diabetes mellitus without complications: Secondary | ICD-10-CM | POA: Insufficient documentation

## 2017-01-31 DIAGNOSIS — Z79899 Other long term (current) drug therapy: Secondary | ICD-10-CM | POA: Diagnosis not present

## 2017-01-31 DIAGNOSIS — Z5189 Encounter for other specified aftercare: Secondary | ICD-10-CM

## 2017-01-31 DIAGNOSIS — Z7984 Long term (current) use of oral hypoglycemic drugs: Secondary | ICD-10-CM | POA: Insufficient documentation

## 2017-01-31 DIAGNOSIS — L0231 Cutaneous abscess of buttock: Secondary | ICD-10-CM | POA: Diagnosis not present

## 2017-01-31 DIAGNOSIS — F17200 Nicotine dependence, unspecified, uncomplicated: Secondary | ICD-10-CM | POA: Insufficient documentation

## 2017-01-31 DIAGNOSIS — Z791 Long term (current) use of non-steroidal anti-inflammatories (NSAID): Secondary | ICD-10-CM | POA: Insufficient documentation

## 2017-01-31 DIAGNOSIS — Z4800 Encounter for change or removal of nonsurgical wound dressing: Secondary | ICD-10-CM | POA: Diagnosis present

## 2017-01-31 LAB — CBC WITH DIFFERENTIAL/PLATELET
BASOS PCT: 2 %
Basophils Absolute: 0.1 10*3/uL (ref 0.0–0.1)
Eosinophils Absolute: 0.3 10*3/uL (ref 0.0–0.7)
Eosinophils Relative: 5 %
HEMATOCRIT: 40.6 % (ref 39.0–52.0)
HEMOGLOBIN: 14.4 g/dL (ref 13.0–17.0)
LYMPHS PCT: 22 %
Lymphs Abs: 1.2 10*3/uL (ref 0.7–4.0)
MCH: 32.2 pg (ref 26.0–34.0)
MCHC: 35.5 g/dL (ref 30.0–36.0)
MCV: 90.8 fL (ref 78.0–100.0)
Monocytes Absolute: 0.6 10*3/uL (ref 0.1–1.0)
Monocytes Relative: 11 %
NEUTROS ABS: 3.3 10*3/uL (ref 1.7–7.7)
NEUTROS PCT: 60 %
Platelets: 195 10*3/uL (ref 150–400)
RBC: 4.47 MIL/uL (ref 4.22–5.81)
RDW: 12 % (ref 11.5–15.5)
WBC: 5.4 10*3/uL (ref 4.0–10.5)

## 2017-01-31 LAB — BASIC METABOLIC PANEL
ANION GAP: 6 (ref 5–15)
BUN: 14 mg/dL (ref 6–20)
CALCIUM: 9 mg/dL (ref 8.9–10.3)
CO2: 23 mmol/L (ref 22–32)
Chloride: 106 mmol/L (ref 101–111)
Creatinine, Ser: 0.81 mg/dL (ref 0.61–1.24)
Glucose, Bld: 259 mg/dL — ABNORMAL HIGH (ref 65–99)
Potassium: 3.9 mmol/L (ref 3.5–5.1)
Sodium: 135 mmol/L (ref 135–145)

## 2017-01-31 NOTE — ED Triage Notes (Addendum)
Pt here for recheck of abscess; sts was I&D'd Monday, recheck Wed and had to receive "IV" and insulin; was told to recheck today.

## 2017-01-31 NOTE — Discharge Instructions (Signed)
Continue take her medicines. Do the sitz baths as we have discussed. Return sooner for fevers or worsening symptoms. Follow-up with your primary care doctor.

## 2017-01-31 NOTE — ED Provider Notes (Signed)
[facility]   CSN: [id number] Arrival date & time: 01/31/17  [id number]     History   Chief Complaint Chief Complaint  Patient presents with  . Wound Check    HPI Tony SaxonCurtis Greener Jr. is a 51 y.o. male.  HPI Patient resents with the wound recheck. Was seen in the ER yesterday for recheck of his abscess. Initially I M.D. On Monday with today being Thursday. Yesterday had sugar of 400. Also had CT scan and IV medicines. Dr. Ranae PalmsYelverton thought the patient admitted but he refused. Returns today for recheck. States he has not checked his sugar this morning. States he has been taking antibiotics. States he has had drainage out of it. States the pain is feeling little better and does not have fevers. He is unsure if he would be admitted to the hospital at that was requested again.   Past Medical History:  Diagnosis Date  . Back pain   . Diabetes mellitus without complication (HCC)   . Sciatica     There are no active problems to display for this patient.   History reviewed. No pertinent surgical history.     Home Medications    Prior to Admission medications   Medication Sig Start Date End Date Taking? Authorizing Provider  clindamycin (CLEOCIN) 300 MG capsule Take 1 capsule (300 mg total) by mouth 4 (four) times daily. X 7 days 01/30/17   Loren Raceravid Yelverton, MD  HYDROcodone-acetaminophen [facility](NORCO) 5-325 MG tablet Take 1-2 tablets by mouth every 4 (four) hours as needed for severe pain. 01/30/17   Loren Raceravid Yelverton, MD  ibuprofen (ADVIL,MOTRIN) 800 MG tablet Take 1 tablet (800 mg total) by mouth every 8 (eight) hours as needed. 10/10/16   Christopher Lawyer, PA-C  METFORMIN HCL PO Take by mouth.    Historical Provider, MD  traMADol (ULTRAM) 50 MG tablet Take 1 tablet (50 mg total) by mouth every 6 (six) hours as needed for severe pain. 10/10/16   Charlestine Nighthristopher Lawyer, PA-C    Family History History reviewed. No pertinent family history.  Social History Social History   Substance Use Topics  . Smoking status: Current Every Day Smoker    Packs/day: 0.50  . Smokeless tobacco: Never Used  . Alcohol use 0.0 oz/week     Comment: weekly     Allergies   Patient has no known allergies.   Review of Systems Review of Systems  Constitutional: Negative for appetite change and fever.  HENT: Negative for congestion.   Respiratory: Negative for chest tightness.   Cardiovascular: Negative for chest pain.  Gastrointestinal: Negative for abdominal pain.  Genitourinary: Negative for dysuria.  Musculoskeletal: Negative for back pain.  Skin: Positive for wound.  Neurological: Negative for headaches.  Hematological: Negative for adenopathy.  Psychiatric/Behavioral: Negative for agitation.     Physical Exam Updated Vital Signs BP 117/88 (BP Location: Left Arm)   Pulse 85   Temp 97.8 F (36.6 C) (Oral)   Resp 18   Ht 5\' 10"  (1.778 m)   Wt 215 lb (97.5 kg)   SpO2 99%   BMI 30.85 kg/m   Physical Exam  Constitutional: He appears well-developed.  HENT:  Head: Atraumatic.  Cardiovascular: Normal rate.   Pulmonary/Chest: Effort normal.  Abdominal: Soft.  Musculoskeletal: He exhibits no edema.  Neurological: He is alert.  Skin: Skin is warm. Capillary refill takes less than 2 seconds.  Patient has approximately 5 cm x 3 cm tender swollen area in her right side of his gluteal  fold. There is some purulent drainage. On the superior aspect of it there is more firmness.  Psychiatric: He has a normal mood and affect.     ED Treatments / Results  Labs (all labs ordered are listed, but only abnormal results are displayed) Labs Reviewed  BASIC METABOLIC PANEL - Abnormal; Notable for the following:       Result Value   Glucose, Bld 259 (*)    All other components within normal limits  CBC WITH DIFFERENTIAL/PLATELET    EKG  EKG Interpretation None       Radiology Ct Pelvis W Contrast  Result Date: 01/30/2017 CLINICAL DATA:  Evaluate for  perirectal abscess. EXAM: CT PELVIS WITH CONTRAST TECHNIQUE: Multidetector CT imaging of the pelvis was performed using the standard protocol following the bolus administration of intravenous contrast. CONTRAST:  ISOVUE-300 IOPAMIDOL (ISOVUE-300) INJECTION 61% COMPARISON:  None. FINDINGS: Urinary Tract:  No abnormality visualized. Bowel:  Unremarkable visualized pelvic bowel loops. Vascular/Lymphatic: Normal appearance of the distal aorta and iliac vessels. Right external iliac node is prominent measuring 11 mm, image 82 of series 2. Prominent right inguinal lymph nodes are also identified. These measure up to 9 mm, image 99 of series 2. I adjacent right inguinal lymph node measures 1 cm, image 96 of series 2. Reproductive:  No mass or other significant abnormality Other: The right gluteal fall appears diffusely edematous with subcutaneous fat stranding and skin thickening. Just below this scan is a small fluid collection containing gas measuring 1.4 x 1.1 cm, image 138 of series 2. Musculoskeletal: There is accentuation of trabecular markings and cortical thinning involving the right iliac bone. IMPRESSION: 1. Cellulitis of the right gluteal fold with small subcutaneous gas and fluid collection compatible with small residual abscess. 2. 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 3. Prominent right external iliac and right inguinal lymph nodes are likely reactive. Electronically Signed   By: Signa Kell M.D.   On: 01/30/2017 10:08    Procedures Procedures (including critical care time)  Medications Ordered in ED Medications - No data to display   Initial Impression / Assessment and Plan / ED Course  I have reviewed the triage vital signs and the nursing notes.  Pertinent labs & imaging results that were available during my care of the patient were reviewed by me and considered in my medical decision making (see chart for details).     Patient with recheck of buttock abscess.  White count reassuring. Patient states it feels better. Still has some drainage. Sugars improved and states he can manage at home. I was pointed is reason to give more trial at home. Instructed on warm soaks. Will return for fevers or worsening symptoms. Continue his antibiotics.  Final Clinical Impressions(s) / ED Diagnoses   Final diagnoses:  Wound check, abscess    New Prescriptions Discharge Medication List as of 01/31/2017 11:08 AM       Benjiman Core, MD 01/31/17 [phone]

## 2017-02-05 ENCOUNTER — Telehealth: Payer: Self-pay | Admitting: Emergency Medicine

## 2017-02-05 LAB — AEROBIC/ANAEROBIC CULTURE W GRAM STAIN (SURGICAL/DEEP WOUND)

## 2017-02-05 LAB — AEROBIC/ANAEROBIC CULTURE (SURGICAL/DEEP WOUND)

## 2017-02-05 NOTE — Telephone Encounter (Signed)
Post ED Visit - Positive Culture Follow-up  Culture report reviewed by antimicrobial stewardship pharmacist:  []  Tony Patel, Pharm.D. []  Tony Patel, Pharm.D., BCPS []  Tony Patel, Pharm.D. [x]  Tony Patel, [address] BCPS []  Tony Patel, [address]Ph[address] BCPS, AAHIVP []  Tony Patel, Pharm.D., BCPS, AAHIVP []  Tony Patel, Pharm.D. []  Tony Patel, [place]  Positive wound culture Treated with clindamycin, organism sensitive to the same and no further patient follow-up is required at this time.  Tony Patel, Tony Patel 02/05/2017, 1:06 PM

## 2017-06-30 ENCOUNTER — Emergency Department (HOSPITAL_BASED_OUTPATIENT_CLINIC_OR_DEPARTMENT_OTHER)
Admission: EM | Admit: 2017-06-30 | Discharge: 2017-06-30 | Disposition: A | Discharge status: Home / Self Care | Payer: Medicaid Other | Attending: Emergency Medicine | Admitting: Emergency Medicine

## 2017-06-30 ENCOUNTER — Encounter (HOSPITAL_BASED_OUTPATIENT_CLINIC_OR_DEPARTMENT_OTHER): Payer: Self-pay | Admitting: Emergency Medicine

## 2017-06-30 DIAGNOSIS — Z79899 Other long term (current) drug therapy: Secondary | ICD-10-CM | POA: Diagnosis not present

## 2017-06-30 DIAGNOSIS — Z23 Encounter for immunization: Secondary | ICD-10-CM | POA: Diagnosis not present

## 2017-06-30 DIAGNOSIS — F17200 Nicotine dependence, unspecified, uncomplicated: Secondary | ICD-10-CM | POA: Diagnosis not present

## 2017-06-30 DIAGNOSIS — E119 Type 2 diabetes mellitus without complications: Secondary | ICD-10-CM | POA: Diagnosis not present

## 2017-06-30 DIAGNOSIS — N492 Inflammatory disorders of scrotum: Secondary | ICD-10-CM | POA: Diagnosis not present

## 2017-06-30 DIAGNOSIS — L0231 Cutaneous abscess of buttock: Secondary | ICD-10-CM | POA: Diagnosis present

## 2017-06-30 MED ORDER — TETANUS-DIPHTH-ACELL PERTUSSIS 5-2.5-18.5 LF-MCG/0.5 IM SUSP
0.5000 mL | Freq: Once | INTRAMUSCULAR | Status: AC
  Administered 2017-06-30: 0.5 mL via INTRAMUSCULAR
  Filled 2017-06-30: qty 0.5

## 2017-06-30 MED ORDER — LIDOCAINE HCL (PF) 1 % IJ SOLN: 10.0000 mL | Freq: Once | INTRAMUSCULAR | Status: DC

## 2017-06-30 MED ORDER — SULFAMETHOXAZOLE-TRIMETHOPRIM 800-160 MG PO TABS: 1.0000 | ORAL_TABLET | Freq: Two times a day (BID) | ORAL | 0 refills | Status: DC

## 2017-06-30 MED ORDER — LIDOCAINE HCL 1 % IJ SOLN
INTRAMUSCULAR | Status: AC
  Administered 2017-06-30: 10 mL
  Filled 2017-06-30: qty 10

## 2017-06-30 NOTE — ED Provider Notes (Signed)
[facility] Provider Note   CSN: [id number] Arrival date & time: 06/30/17  0743     History   Chief Complaint Chief [id number]  Patient presents with  . Abscess    HPI Tony SaxonCurtis Stahnke Jr. is a 51 y.o. male.  Patient is a 51 year old male with a history of diabetes who presents with an abscess. He states he's had an abscess on his scrotal area for about 2 weeks and it got larger over last 2-3 days. He has localized pain to the area. No fevers. No nausea or vomiting. No pain on bowel movements. He had a recent abscess to his buttocks area in February of this year. His tetanus shot is not up-to-date. He has constant throbbing pain to the area which has worsened over the last 2-3 days.      Past Medical History:  Diagnosis Date  . Back pain   . Diabetes mellitus without complication (HCC)   . Sciatica     There are no active problems to display for this patient.   History reviewed. No pertinent surgical history.     Home Medications    Prior to Admission medications   Medication Sig Start Date End Date Taking? Authorizing Provider  HYDROcodone-acetaminophen (NORCO) 5-325 MG tablet Take 1-2 tablets by mouth every 4 (four) hours as needed for severe pain. 01/30/17  Yes Loren RacerYelverton, David, MD  ibuprofen (ADVIL,MOTRIN) 800 MG tablet Take 1 tablet (800 mg total) by mouth every 8 (eight) hours as needed. 10/10/16  Yes Lawyer, Cristal Deerhristopher, PA-C  METFORMIN HCL PO Take 1,000 mg by mouth 2 (two) times daily.    Yes [provider]  clindamycin (CLEOCIN) 300 MG capsule Take 1 capsule (300 mg total) by mouth 4 (four) times daily. X 7 days 01/30/17   Loren RacerYelverton, David, MD  sulfamethoxazole-trimethoprim (BACTRIM DS,SEPTRA DS) 800-160 MG tablet Take 1 tablet by mouth 2 (two) times daily. 06/30/17 07/07/17  Rolan BuccoBelfi, Dalis Beers, MD  traMADol (ULTRAM) 50 MG tablet Take 1 tablet (50 mg total) by mouth every 6 (six) hours as needed for severe pain. 10/10/16   Charlestine NightLawyer, Christopher, PA-C      Family History No family history on file.  Social History Social History  Substance Use Topics  . Smoking status: Current Every Day Smoker    Packs/day: 0.50  . Smokeless tobacco: Never Used  . Alcohol use 0.0 oz/week     Comment: weekly     Allergies   Patient has no known allergies.   Review of Systems Review of Systems  Constitutional: Negative for fever.  Gastrointestinal: Negative for abdominal pain, nausea and vomiting.  Genitourinary: Positive for scrotal swelling. Negative for testicular pain.  Skin: Positive for wound.  Neurological: Negative for headaches.     Physical Exam Updated Vital Signs BP 125/85 (BP Location: Right Arm)   Pulse 81   Temp 98.7 F (37.1 C) (Oral)   Resp 16   Ht 5\' 10"  (1.778 m)   Wt 93 kg (205 lb)   SpO2 98%   BMI 29.41 kg/m   Physical Exam  Constitutional: He is oriented to person, place, and time. He appears well-developed and well-nourished. No distress.  Cardiovascular: Normal rate.   Pulmonary/Chest: Effort normal.  Genitourinary:  Genitourinary Comments: Patient has a 2 cm fluctuant abscess to the base of the scrotal sac. There is minimal erythema. No surrounding tenderness. No crepitus of the skin. No apparent extension into the perirectal area.  Neurological: He is alert and oriented to person, place,  and time.  Skin: Skin is warm and dry.     ED Treatments / Results  Labs (all labs ordered are listed, but only abnormal results are displayed) Labs Reviewed - No data to display  EKG  EKG Interpretation None       Radiology No results found.  Procedures .Marland KitchenIncision and Drainage Date/Time: 06/30/2017 8:14 AM Performed by: Concha Sudol Authorized by: Rolan Bucco   Consent:    Consent obtained:  Verbal   Consent given by:  Patient   Risks discussed:  Bleeding, incomplete drainage, infection and pain   Alternatives discussed:  No treatment Location:    Type:  Abscess   Size:  2cm   Location:   Anogenital   Anogenital location:  Scrotal wall Pre-procedure details:    Skin preparation:  Betadine and Chloraprep Anesthesia (see MAR for exact dosages):    Anesthesia method:  Local infiltration   Local anesthetic:  Lidocaine 1% w/o epi Procedure type:    Complexity:  Simple Procedure details:    Incision types:  Elliptical   Incision depth:  Dermal   Scalpel blade:  11   Wound management:  Probed and deloculated   Drainage:  Purulent   Drainage amount:  Moderate   Wound treatment:  Wound left open   Packing materials:  None Post-procedure details:    Patient tolerance of procedure:  Tolerated well, no immediate complications   (including critical care time)  Medications Ordered in ED Medications  lidocaine (PF) (XYLOCAINE) 1 % injection 10 mL (not administered)  lidocaine (XYLOCAINE) 1 % (with pres) injection (not administered)  Tdap (BOOSTRIX) injection 0.5 mL (not administered)     Initial Impression / Assessment and Plan / ED Course  I have reviewed the triage vital signs and the nursing notes.  Pertinent labs & imaging results that were available during my care of the patient were reviewed by me and considered in my medical decision making (see chart for details).     Patient presents with a small scrotal abscess. There is no signs of surrounding infection or Fournier's gangrene. There is no evidence of extension into the perirectal space. The wound was opened in the ED with a moderate amount of purulent discharge. His tetanus shot was updated. He was discharged from good condition. He was given wound care instructions. He was started on Bactrim. He was advised to have a wound check by his PCP in 2-3 days or return here as needed for any worsening symptoms  Final Clinical Impressions(s) / ED Diagnoses   Final diagnoses:  Scrotal abscess    New Prescriptions New Prescriptions   SULFAMETHOXAZOLE-TRIMETHOPRIM (BACTRIM DS,SEPTRA DS) 800-160 MG TABLET    Take 1  tablet by mouth 2 (two) times daily.     Rolan Bucco, MD 06/30/17 [phone]

## 2017-06-30 NOTE — ED Triage Notes (Addendum)
?   Abscess to buttock area x 2 weeks ,worse x 3 days . Prior abscess to same area x 6 months ago

## 2017-07-02 ENCOUNTER — Encounter (HOSPITAL_BASED_OUTPATIENT_CLINIC_OR_DEPARTMENT_OTHER): Payer: Self-pay | Admitting: Emergency Medicine

## 2017-07-02 ENCOUNTER — Emergency Department (HOSPITAL_BASED_OUTPATIENT_CLINIC_OR_DEPARTMENT_OTHER)
Admission: EM | Admit: 2017-07-02 | Discharge: 2017-07-02 | Disposition: A | Discharge status: Home / Self Care | Payer: Medicaid Other | Attending: Emergency Medicine | Admitting: Emergency Medicine

## 2017-07-02 DIAGNOSIS — F17210 Nicotine dependence, cigarettes, uncomplicated: Secondary | ICD-10-CM | POA: Insufficient documentation

## 2017-07-02 DIAGNOSIS — Z79899 Other long term (current) drug therapy: Secondary | ICD-10-CM | POA: Insufficient documentation

## 2017-07-02 DIAGNOSIS — Z5189 Encounter for other specified aftercare: Secondary | ICD-10-CM | POA: Insufficient documentation

## 2017-07-02 DIAGNOSIS — E871 Hypo-osmolality and hyponatremia: Secondary | ICD-10-CM

## 2017-07-02 DIAGNOSIS — N492 Inflammatory disorders of scrotum: Secondary | ICD-10-CM | POA: Insufficient documentation

## 2017-07-02 DIAGNOSIS — R6883 Chills (without fever): Secondary | ICD-10-CM | POA: Diagnosis not present

## 2017-07-02 DIAGNOSIS — R509 Fever, unspecified: Secondary | ICD-10-CM | POA: Diagnosis present

## 2017-07-02 LAB — BASIC METABOLIC PANEL
Anion gap: 10 (ref 5–15)
Anion gap: 9 (ref 5–15)
BUN: 18 mg/dL (ref 6–20)
BUN: 19 mg/dL (ref 6–20)
CALCIUM: 8.7 mg/dL — AB (ref 8.9–10.3)
CALCIUM: 8.3 mg/dL — AB (ref 8.9–10.3)
CHLORIDE: 96 mmol/L — AB (ref 101–111)
CO2: 21 mmol/L — AB (ref 22–32)
CO2: 22 mmol/L (ref 22–32)
CREATININE: 0.89 mg/dL (ref 0.61–1.24)
CREATININE: 0.93 mg/dL (ref 0.61–1.24)
Chloride: 96 mmol/L — ABNORMAL LOW (ref 101–111)
GFR calc Af Amer: >60 mL/min (ref >60)
GFR calc non Af Amer: >60 mL/min (ref >60)
GLUCOSE: 276 mg/dL — AB (ref 65–99)
Glucose, Bld: 295 mg/dL — ABNORMAL HIGH (ref 65–99)
Potassium: 3.7 mmol/L (ref 3.5–5.1)
Potassium: 3.8 mmol/L (ref 3.5–5.1)
SODIUM: 127 mmol/L — AB (ref 135–145)
Sodium: 127 mmol/L — ABNORMAL LOW (ref 135–145)

## 2017-07-02 LAB — CBC WITH DIFFERENTIAL/PLATELET
BASOS ABS: 0 10*3/uL (ref 0.0–0.1)
BASOS PCT: 0 %
EOS PCT: 0 %
Eosinophils Absolute: 0 10*3/uL (ref 0.0–0.7)
HEMATOCRIT: 42.1 % (ref 39.0–52.0)
Hemoglobin: 15.3 g/dL (ref 13.0–17.0)
Lymphocytes Relative: 6 %
Lymphs Abs: 0.4 10*3/uL — ABNORMAL LOW (ref 0.7–4.0)
MCH: 32.3 pg (ref 26.0–34.0)
MCHC: 36.3 g/dL — AB (ref 30.0–36.0)
MCV: 88.8 fL (ref 78.0–100.0)
MONO ABS: 0.2 10*3/uL (ref 0.1–1.0)
MONOS PCT: 3 %
NEUTROS ABS: 7.1 10*3/uL (ref 1.7–7.7)
Neutrophils Relative %: 91 %
PLATELETS: 167 10*3/uL (ref 150–400)
RBC: 4.74 MIL/uL (ref 4.22–5.81)
RDW: 13.1 % (ref 11.5–15.5)
WBC: 7.8 10*3/uL (ref 4.0–10.5)

## 2017-07-02 LAB — CBG MONITORING, ED: GLUCOSE-CAPILLARY: 277 mg/dL — AB (ref 65–99)

## 2017-07-02 LAB — I-STAT CG4 LACTIC ACID, ED: Lactic Acid, Venous: 1.36 mmol/L (ref 0.5–1.9)

## 2017-07-02 MED ORDER — CEPHALEXIN 250 MG PO CAPS
500.0000 mg | ORAL_CAPSULE | Freq: Once | ORAL | Status: AC
  Administered 2017-07-02: 500 mg via ORAL
  Filled 2017-07-02: qty 2

## 2017-07-02 MED ORDER — SODIUM CHLORIDE 0.9 % IV BOLUS (SEPSIS)
500.0000 mL | Freq: Once | INTRAVENOUS | Status: AC
  Administered 2017-07-02: 500 mL via INTRAVENOUS

## 2017-07-02 MED ORDER — SULFAMETHOXAZOLE-TRIMETHOPRIM 800-160 MG PO TABS
1.0000 | ORAL_TABLET | Freq: Once | ORAL | Status: AC
Start: 2017-07-02 — End: 2017-07-02
  Administered 2017-07-02: 1 via ORAL
  Filled 2017-07-02: qty 1

## 2017-07-02 MED ORDER — CEPHALEXIN 500 MG PO CAPS
500.0000 mg | ORAL_CAPSULE | Freq: Three times a day (TID) | ORAL | 0 refills | Status: DC
Start: 2017-07-02 — End: 2017-07-02

## 2017-07-02 MED ORDER — SODIUM CHLORIDE 0.9 % IV BOLUS (SEPSIS)
500.0000 mL | Freq: Once | INTRAVENOUS | Status: AC
  Administered 2017-07-02: 1000 mL via INTRAVENOUS

## 2017-07-02 MED ORDER — CLINDAMYCIN HCL 300 MG PO CAPS: 300.0000 mg | ORAL_CAPSULE | Freq: Three times a day (TID) | ORAL | 0 refills | Status: DC

## 2017-07-02 NOTE — ED Provider Notes (Signed)
[facility]   CSN: [id number] Arrival date & time: 07/02/17  0411     History   Chief Complaint Chief Complaint  Patient presents with  . Fever    HPI Tony Patel. is a 51 y.o. male.  Patient presents with chills and fever that onset last night. States he had chills before he went to bed and then awoke around 4 AM with chills as well. Temperature was 100.2 orally. Patient was concerned because he had abscess to his scrotum drained 2 days ago and has had 2 doses of Bactrim. Denies any vomiting or abdominal pain. Denies any cough, runny nose or sore throat. He is a diabetic on metformin. He last took ibuprofen about one hour ago. No pain with urination or hematuria.   The history is provided by the patient and the spouse.  Fever   Pertinent negatives include no vomiting, no congestion, no headaches and no cough.    Past Medical History:  Diagnosis Date  . Back pain   . Diabetes mellitus without complication (HCC)   . Sciatica     There are no active problems to display for this patient.   History reviewed. No pertinent surgical history.     Home Medications    Prior to Admission medications   Medication Sig Start Date End Date Taking? Authorizing Provider  HYDROcodone-acetaminophen (NORCO) 5-325 MG tablet Take 1-2 tablets by mouth every 4 (four) hours as needed for severe pain. 01/30/17  Yes Loren Racer, MD  ibuprofen (ADVIL,MOTRIN) 800 MG tablet Take 1 tablet (800 mg total) by mouth every 8 (eight) hours as needed. 10/10/16  Yes Lawyer, Cristal Deer, PA-C  METFORMIN HCL PO Take 1,000 mg by mouth 2 (two) times daily.    Yes [provider]  sulfamethoxazole-trimethoprim (BACTRIM DS,SEPTRA DS) 800-160 MG tablet Take 1 tablet by mouth 2 (two) times daily. 06/30/17 07/07/17 Yes Rolan Bucco, MD  clindamycin (CLEOCIN) 300 MG capsule Take 1 capsule (300 mg total) by mouth 4 (four) times daily. X 7 days 01/30/17   Loren Racer, MD   traMADol (ULTRAM) 50 MG tablet Take 1 tablet (50 mg total) by mouth every 6 (six) hours as needed for severe pain. 10/10/16   Charlestine Night, PA-C    Family History No family history on file.  Social History Social History  Substance Use Topics  . Smoking status: Current Every Day Smoker    Packs/day: 0.50  . Smokeless tobacco: Never Used  . Alcohol use 0.0 oz/week     Comment: weekly     Allergies   Patient has no known allergies.   Review of Systems Review of Systems  Constitutional: Positive for chills and fever. Negative for activity change and appetite change.  HENT: Negative for congestion and sinus pain.   Respiratory: Negative for cough, chest tightness and shortness of breath.   Gastrointestinal: Negative for abdominal pain, nausea and vomiting.  Genitourinary: Negative for dysuria and hematuria.  Skin: Positive for wound.  Neurological: Negative for dizziness, weakness and headaches.    all other systems are negative except as noted in the HPI and PMH.    Physical Exam Updated Vital Signs BP 113/83 (BP Location: Left Arm)   Pulse (!) 110   Temp 99.6 F (37.6 C) (Oral)   Resp 18   Ht 5\' 10"  (1.778 m)   Wt 93 kg (205 lb)   SpO2 97%   BMI 29.41 kg/m   Physical Exam  Constitutional: He is oriented to  person, place, and time. He appears well-developed and well-nourished. No distress.  HENT:  Head: Normocephalic and atraumatic.  Mouth/Throat: Oropharynx is clear and moist. No oropharyngeal exudate.  Eyes: Pupils are equal, round, and reactive to light. Conjunctivae and EOM are normal.  Neck: Normal range of motion. Neck supple.  No meningismus.  Cardiovascular: Normal rate, regular rhythm, normal heart sounds and intact distal pulses.   No murmur heard. Pulmonary/Chest: Effort normal and breath sounds normal. No respiratory distress.  Abdominal: Soft. There is no tenderness. There is no rebound and no guarding.  Genitourinary:      Genitourinary Comments: 1 cm incision with minimal surrounding erythema and induration. No fluctuance. No crepitus. No extension to perirectal area or scrotum  Musculoskeletal: Normal range of motion. He exhibits no edema or tenderness.  Neurological: He is alert and oriented to person, place, and time. No cranial nerve deficit. He exhibits normal muscle tone. Coordination normal.  No ataxia on finger to nose bilaterally. No pronator drift. 5/5 strength throughout. CN 2-12 intact.Equal grip strength. Sensation intact.   Skin: Skin is warm.  Psychiatric: He has a normal mood and affect. His behavior is normal.  Nursing note and vitals reviewed.    ED Treatments / Results  Labs (all labs ordered are listed, but only abnormal results are displayed) Labs Reviewed  CBC WITH DIFFERENTIAL/PLATELET - Abnormal; Notable for the following:       Result Value   MCHC 36.3 (*)    Lymphs Abs 0.4 (*)    All other components within normal limits  BASIC METABOLIC PANEL - Abnormal; Notable for the following:    Sodium 127 (*)    Chloride 96 (*)    CO2 21 (*)    Glucose, Bld 276 (*)    Calcium 8.7 (*)    All other components within normal limits  CBG MONITORING, ED - Abnormal; Notable for the following:    Glucose-Capillary 277 (*)    All other components within normal limits  CULTURE, BLOOD (ROUTINE X 2)  CULTURE, BLOOD (ROUTINE X 2)  I-STAT CG4 LACTIC ACID, ED    EKG  EKG Interpretation None       Radiology No results found.  Procedures Procedures (including critical care time)  Medications Ordered in ED Medications  sulfamethoxazole-trimethoprim (BACTRIM DS,SEPTRA DS) 800-160 MG per tablet 1 tablet (not administered)  cephALEXin (KEFLEX) capsule 500 mg (not administered)     Initial Impression / Assessment and Plan / ED Course  I have reviewed the triage vital signs and the nursing notes.  Pertinent labs & imaging results that were available during my care of the  patient were reviewed by me and considered in my medical decision making (see chart for details).    Patient with chills and possible fever.  Recently drained scrotal abscess. Nontoxic and well appearing.   Exam shows healing abscess with minimal surrounding induration and erythema. There is no extension to the scrotum or perirectal area. No evidence of Fourneir's gangrene.  Lactate and WBC normal.  Hyperglycemia without DKA. Hyponatremia of 127.   IVF given gently. Mild asymptomatic hyponatremia. May be due to bactrim use.  Stop bactrim. Start clindamycin instead.  Follow up with PCP for wound check in 2-3 days. Should also have sodium level rechecked as well. Return to the ED with persistent fever, worsening infection or any other concerns.  Final Clinical Impressions(s) / ED Diagnoses   Final diagnoses:  Wound check, abscess  Chills  Hyponatremia    New  Prescriptions New Prescriptions   No medications on file     Glynn Octave, MD 07/02/17 [phone]

## 2017-07-02 NOTE — ED Triage Notes (Signed)
Fever since last night. Was eval for abscess on Sunday and has had 2 doses of Bactrim. Took Ibuprofen about 1 hr ago

## 2017-07-02 NOTE — Discharge Instructions (Signed)
Stop taking bactrim. Take clindamycin instead. Follow up with your doctor for a wound check in 2-3 days. You should also have your sodium rechecked by your doctor. Return to the ED if you develop worsening pain, persistent fever, or any other concerns.

## 2017-07-02 NOTE — ED Notes (Signed)
ED Provider at bedside. 

## 2017-07-02 NOTE — ED Notes (Signed)
Eating breakfast from [employer].

## 2017-07-03 ENCOUNTER — Encounter (HOSPITAL_BASED_OUTPATIENT_CLINIC_OR_DEPARTMENT_OTHER): Payer: Self-pay | Admitting: *Deleted

## 2017-07-03 ENCOUNTER — Emergency Department (HOSPITAL_BASED_OUTPATIENT_CLINIC_OR_DEPARTMENT_OTHER): Payer: Medicaid Other

## 2017-07-03 ENCOUNTER — Emergency Department (HOSPITAL_BASED_OUTPATIENT_CLINIC_OR_DEPARTMENT_OTHER)
Admission: EM | Admit: 2017-07-03 | Discharge: 2017-07-03 | Disposition: A | Discharge status: Home / Self Care | Payer: Medicaid Other | Attending: Emergency Medicine | Admitting: Emergency Medicine

## 2017-07-03 DIAGNOSIS — E119 Type 2 diabetes mellitus without complications: Secondary | ICD-10-CM | POA: Insufficient documentation

## 2017-07-03 DIAGNOSIS — R509 Fever, unspecified: Secondary | ICD-10-CM | POA: Insufficient documentation

## 2017-07-03 DIAGNOSIS — Z79899 Other long term (current) drug therapy: Secondary | ICD-10-CM | POA: Diagnosis not present

## 2017-07-03 DIAGNOSIS — Z7984 Long term (current) use of oral hypoglycemic drugs: Secondary | ICD-10-CM | POA: Insufficient documentation

## 2017-07-03 DIAGNOSIS — F17200 Nicotine dependence, unspecified, uncomplicated: Secondary | ICD-10-CM | POA: Insufficient documentation

## 2017-07-03 LAB — CBC WITH DIFFERENTIAL/PLATELET
Basophils Absolute: 0 10*3/uL (ref 0.0–0.1)
Basophils Relative: 0 %
Eosinophils Absolute: 0 10*3/uL (ref 0.0–0.7)
Eosinophils Relative: 0 %
HCT: 41.2 % (ref 39.0–52.0)
HEMOGLOBIN: 15 g/dL (ref 13.0–17.0)
LYMPHS ABS: 0.4 10*3/uL — AB (ref 0.7–4.0)
LYMPHS PCT: 10 %
MCH: 32.7 pg (ref 26.0–34.0)
MCHC: 36.4 g/dL — AB (ref 30.0–36.0)
MCV: 89.8 fL (ref 78.0–100.0)
MONOS PCT: 10 %
Monocytes Absolute: 0.3 10*3/uL (ref 0.1–1.0)
NEUTROS PCT: 80 %
Neutro Abs: 2.9 10*3/uL (ref 1.7–7.7)
Platelets: 154 10*3/uL (ref 150–400)
RBC: 4.59 MIL/uL (ref 4.22–5.81)
RDW: 13.2 % (ref 11.5–15.5)
WBC: 3.6 10*3/uL — AB (ref 4.0–10.5)

## 2017-07-03 LAB — URINALYSIS, ROUTINE W REFLEX MICROSCOPIC
Bilirubin Urine: NEGATIVE
Glucose, UA: >=500 mg/dL — AB
Ketones, ur: 15 mg/dL — AB
Leukocytes, UA: NEGATIVE
Nitrite: NEGATIVE
PH: 6 (ref 5.0–8.0)
PROTEIN: 30 mg/dL — AB
Specific Gravity, Urine: 1.042 — ABNORMAL HIGH (ref 1.005–1.030)

## 2017-07-03 LAB — URINALYSIS, MICROSCOPIC (REFLEX)

## 2017-07-03 LAB — COMPREHENSIVE METABOLIC PANEL
ALT: 35 U/L (ref 17–63)
ANION GAP: 13 (ref 5–15)
AST: 41 U/L (ref 15–41)
Albumin: 3.5 g/dL (ref 3.5–5.0)
Alkaline Phosphatase: 51 U/L (ref 38–126)
BUN: 14 mg/dL (ref 6–20)
CHLORIDE: 100 mmol/L — AB (ref 101–111)
CO2: 20 mmol/L — AB (ref 22–32)
Calcium: 8.7 mg/dL — ABNORMAL LOW (ref 8.9–10.3)
Creatinine, Ser: 0.97 mg/dL (ref 0.61–1.24)
Glucose, Bld: 295 mg/dL — ABNORMAL HIGH (ref 65–99)
POTASSIUM: 3.6 mmol/L (ref 3.5–5.1)
SODIUM: 133 mmol/L — AB (ref 135–145)
Total Bilirubin: 0.3 mg/dL (ref 0.3–1.2)
Total Protein: 7.1 g/dL (ref 6.5–8.1)

## 2017-07-03 LAB — SEDIMENTATION RATE: SED RATE: 19 mm/h — AB (ref 0–16)

## 2017-07-03 LAB — I-STAT CG4 LACTIC ACID, ED
Lactic Acid, Venous: 2 mmol/L (ref 0.5–1.9)
Lactic Acid, Venous: 1.05 mmol/L (ref 0.5–1.9)

## 2017-07-03 LAB — CBG MONITORING, ED: GLUCOSE-CAPILLARY: 291 mg/dL — AB (ref 65–99)

## 2017-07-03 MED ORDER — IOPAMIDOL (ISOVUE-300) INJECTION 61%
100.0000 mL | Freq: Once | INTRAVENOUS | Status: AC | PRN
  Administered 2017-07-03: 100 mL via INTRAVENOUS

## 2017-07-03 MED ORDER — ONDANSETRON 8 MG PO TBDP
8.0000 mg | ORAL_TABLET | Freq: Once | ORAL | Status: DC
  Filled 2017-07-03: qty 1

## 2017-07-03 MED ORDER — ACETAMINOPHEN 500 MG PO TABS
1000.0000 mg | ORAL_TABLET | Freq: Once | ORAL | Status: AC
  Administered 2017-07-03: 1000 mg via ORAL
  Filled 2017-07-03: qty 2

## 2017-07-03 MED ORDER — ONDANSETRON HCL 4 MG/2ML IJ SOLN
4.0000 mg | Freq: Once | INTRAMUSCULAR | Status: AC
  Administered 2017-07-03: 4 mg via INTRAVENOUS
  Filled 2017-07-03: qty 2

## 2017-07-03 MED ORDER — SODIUM CHLORIDE 0.9 % IV BOLUS (SEPSIS)
1000.0000 mL | Freq: Once | INTRAVENOUS | Status: AC
  Administered 2017-07-03: 1000 mL via INTRAVENOUS

## 2017-07-03 NOTE — Discharge Instructions (Signed)
You were seen in the ED today with fever. The CT scan showed no evidence of abscess. We are recommending discontinuing your antibiotics and will follow your blood cultures to see if any bacteria grow.   Return to the ED with any new or worsening symptoms.

## 2017-07-03 NOTE — ED Triage Notes (Signed)
Pt presents with continued fever took Ibuprofen 30 min ago. Was seen 07/17 for same sx.

## 2017-07-03 NOTE — ED Notes (Signed)
ED Provider at bedside. 

## 2017-07-03 NOTE — ED Notes (Signed)
Patient transported to CT 

## 2017-07-03 NOTE — ED Provider Notes (Signed)
Blood pressure 105/79, pulse 69, temperature 98.6 F (37 C), resp. rate 18, SpO2 99 %.  Assuming care from Dr. Preston FleetingGlick.  In short, Tony SaxonCurtis Hollibaugh Jr. is a 51 y.o. male with a chief complaint of Fever .  Refer to the original H&P for additional details.  The current plan of care is to follow CT and reassess.   09:45 AM CT scan resulted after patient had to return to the radiology suite for additional imaging. No residual abscess found. Repeat lactic acid normal. Per plan from prior physician will advise the patient discontinue antibiotics to wait for culture results. Will follow with PCP in the coming days.   At this time, I do not feel there is any life-threatening condition present. I have reviewed and discussed all results (EKG, imaging, lab, urine as appropriate), exam findings with patient. I have reviewed nursing notes and appropriate previous records.  I feel the patient is safe to be discharged home without further emergent workup. Discussed usual and customary return precautions. Patient and family (if present) verbalize understanding and are comfortable with this plan.  Patient will follow-up with their primary care provider. If they do not have a primary care provider, information for follow-up has been provided to them. All questions have been answered.  Alona BeneJoshua Long, MD    Maia PlanLong, Joshua G, MD 07/03/17 [phone]

## 2017-07-03 NOTE — ED Provider Notes (Signed)
[facility]   CSN: [id number] Arrival date & time: 07/03/17  0212     History   Chief Complaint Chief Complaint  Patient presents with  . Fever    HPI Tony Patel. is a 51 y.o. male.  The history is provided by the patient.  Fever    He had been seen in emergency 2 days ago with a perineal abscess which was incised and drained. He was given a prescription for trimethoprim-sulfamethoxazole. He did not get that filled until the next day. He came in yesterday with fever and chills. His antibiotic was switched to clindamycin, but he has not had that prescription filled yet. Tonight, he woke up with fever to 102, chills and sweats. He denies any pain at the site of incision of his abscess, and states that it stopped draining after 1 day.  Past Medical History:  Diagnosis Date  . Back pain   . Diabetes mellitus without complication (HCC)   . Sciatica     There are no active problems to display for this patient.   History reviewed. No pertinent surgical history.     Home Medications    Prior to Admission medications   Medication Sig Start Date End Date Taking? Authorizing Provider  clindamycin (CLEOCIN) 300 MG capsule Take 1 capsule (300 mg total) by mouth 3 (three) times daily. 07/02/17   Rancour, Jeannett Senior, MD  HYDROcodone-acetaminophen (NORCO) 5-325 MG tablet Take 1-2 tablets by mouth every 4 (four) hours as needed for severe pain. 01/30/17   Loren Racer, MD  ibuprofen (ADVIL,MOTRIN) 800 MG tablet Take 1 tablet (800 mg total) by mouth every 8 (eight) hours as needed. 10/10/16   Lawyer, Cristal Deer, PA-C  METFORMIN HCL PO Take 1,000 mg by mouth 2 (two) times daily.     [provider]  traMADol (ULTRAM) 50 MG tablet Take 1 tablet (50 mg total) by mouth every 6 (six) hours as needed for severe pain. 10/10/16   Charlestine Night, PA-C    Family History History reviewed. No pertinent family history.  Social History Social  History  Substance Use Topics  . Smoking status: Current Every Day Smoker    Packs/day: 0.50  . Smokeless tobacco: Never Used  . Alcohol use 0.0 oz/week     Comment: weekly     Allergies   Patient has no known allergies.   Review of Systems Review of Systems  Constitutional: Positive for fever.  All other systems reviewed and are negative.    Physical Exam Updated Vital Signs BP 104/78 (BP Location: Right Arm)   Pulse 99   Temp 99.2 F (37.3 C) (Oral)   Resp 20   SpO2 95%   Physical Exam  Nursing note and vitals reviewed.  51 year old male, resting comfortably and in no acute distress. Vital signs are significant for fever and tachycardia-both of these have resolved following 1 L of IV fluids and oral acetaminophen. Oxygen saturation is 95%, which is normal. Head is normocephalic and atraumatic. PERRLA, EOMI. Oropharynx is clear. Neck is nontender and supple without adenopathy or JVD. Back is nontender and there is no CVA tenderness. Lungs are clear without rales, wheezes, or rhonchi. Chest is nontender. Heart has regular rate and rhythm without murmur. Abdomen is soft, flat, nontender without masses or hepatosplenomegaly and peristalsis is normoactive.. Genitalia: Circumcised penis. Testes descended with a nontender. No scrotal masses. Perineal incision appears to be healing well without any erythema, induration, tenderness, drainage. Extremities have no cyanosis  or edema, full range of motion is present. Skin is warm and dry without rash. Neurologic: Mental status is normal, cranial nerves are  intact, there are no motor or sensory deficits.  ED Treatments / Results  Labs (all labs ordered are listed, but only abnormal results are displayed) Labs Reviewed  URINALYSIS, ROUTINE W REFLEX MICROSCOPIC - Abnormal; Notable for the following:       Result Value   Specific Gravity, Urine 1.042 (*)    Glucose, UA >=500 (*)    Hgb urine dipstick SMALL (*)    Ketones, ur  15 (*)    Protein, ur 30 (*)    All other components within normal limits  URINALYSIS, MICROSCOPIC (REFLEX) - Abnormal; Notable for the following:    Bacteria, UA RARE (*)    Squamous Epithelial / LPF 0-5 (*)    All other components within normal limits  COMPREHENSIVE METABOLIC PANEL - Abnormal; Notable for the following:    Sodium 133 (*)    Chloride 100 (*)    CO2 20 (*)    Glucose, Bld 295 (*)    Calcium 8.7 (*)    All other components within normal limits  CBC WITH DIFFERENTIAL/PLATELET - Abnormal; Notable for the following:    WBC 3.6 (*)    MCHC 36.4 (*)    Lymphs Abs 0.4 (*)    All other components within normal limits  SEDIMENTATION RATE - Abnormal; Notable for the following:    Sed Rate 19 (*)    All other components within normal limits  CBG MONITORING, ED - Abnormal; Notable for the following:    Glucose-Capillary 291 (*)    All other components within normal limits  I-STAT CG4 LACTIC ACID, ED - Abnormal; Notable for the following:    Lactic Acid, Venous 2.00 (*)    All other components within normal limits  CULTURE, BLOOD (ROUTINE X 2)  CULTURE, BLOOD (ROUTINE X 2)  LACTIC ACID, PLASMA  LACTIC ACID, PLASMA   Radiology No results found.  Procedures Procedures (including critical care time)  Medications Ordered in ED Medications  acetaminophen (TYLENOL) tablet 1,000 mg (1,000 mg Oral Given 07/03/17 0240)  ondansetron (ZOFRAN) injection 4 mg (4 mg Intravenous Given 07/03/17 0309)  sodium chloride 0.9 % bolus 1,000 mL (0 mLs Intravenous Stopped 07/03/17 0400)  iopamidol (ISOVUE-300) 61 % injection 100 mL (100 mLs Intravenous Contrast Given 07/03/17 0654)     Initial Impression / Assessment and Plan / ED Course  I have reviewed the triage vital signs and the nursing notes.  Pertinent labs & imaging results that were available during my care of the patient were reviewed by me and considered in my medical decision making (see chart for details).  Fever and chills  following incision and drainage of perineal abscess. Old records are reviewed confirming incision and drainage done 3 days ago, and ED visit yesterday for fever with change in antibiotics. However, he has not started the clindamycin. It is possible that his symptoms are from allergy to trimethoprim-sulfamethoxazole. Given history of diabetes, will check CT of pelvis to rule out deep abscess. Clinically, he shows no signs of Fournier's gangrene.  Laboratory workup is unremarkable. WBC is 3.6 with normal differential. Moderate hyperglycemia is present, however, not significantly changed from recent values. Anion gap is normal. Lactic acid level is borderline at 2.0. I have reviewed his CT scan and see no evidence of deep infection or abscess of, but radiologist's interpretation is pending. Case is signed out to  Dr. Jacqulyn BathLong pending radiologist interpretation of CT scan. I anticipate discontinuing antibiotics and waiting for culture results to come back.  Final Clinical Impressions(s) / ED Diagnoses   Final diagnoses:  Fever in adult    New Prescriptions New Prescriptions   No medications on file     Dione BoozeGlick, Travone Georg, MD 07/03/17 [phone]

## 2017-07-07 LAB — CULTURE, BLOOD (ROUTINE X 2)
CULTURE: NO GROWTH
Culture: NO GROWTH
Special Requests: ADEQUATE
Special Requests: ADEQUATE

## 2017-07-08 LAB — CULTURE, BLOOD (ROUTINE X 2)
CULTURE: NO GROWTH
Culture: NO GROWTH
Special Requests: ADEQUATE
Special Requests: ADEQUATE

## 2017-09-05 ENCOUNTER — Emergency Department (HOSPITAL_BASED_OUTPATIENT_CLINIC_OR_DEPARTMENT_OTHER): Payer: Medicaid Other

## 2017-09-05 ENCOUNTER — Encounter (HOSPITAL_BASED_OUTPATIENT_CLINIC_OR_DEPARTMENT_OTHER): Payer: Self-pay | Admitting: Emergency Medicine

## 2017-09-05 ENCOUNTER — Emergency Department (HOSPITAL_BASED_OUTPATIENT_CLINIC_OR_DEPARTMENT_OTHER)
Admission: EM | Admit: 2017-09-05 | Discharge: 2017-09-05 | Disposition: A | Discharge status: Home / Self Care | Payer: Medicaid Other | Attending: Emergency Medicine | Admitting: Emergency Medicine

## 2017-09-05 DIAGNOSIS — Y929 Unspecified place or not applicable: Secondary | ICD-10-CM | POA: Insufficient documentation

## 2017-09-05 DIAGNOSIS — M546 Pain in thoracic spine: Secondary | ICD-10-CM

## 2017-09-05 DIAGNOSIS — Y939 Activity, unspecified: Secondary | ICD-10-CM | POA: Insufficient documentation

## 2017-09-05 DIAGNOSIS — Z7984 Long term (current) use of oral hypoglycemic drugs: Secondary | ICD-10-CM | POA: Diagnosis not present

## 2017-09-05 DIAGNOSIS — S29012A Strain of muscle and tendon of back wall of thorax, initial encounter: Secondary | ICD-10-CM | POA: Diagnosis not present

## 2017-09-05 DIAGNOSIS — T148XXA Other injury of unspecified body region, initial encounter: Secondary | ICD-10-CM

## 2017-09-05 DIAGNOSIS — Y999 Unspecified external cause status: Secondary | ICD-10-CM | POA: Diagnosis not present

## 2017-09-05 DIAGNOSIS — E119 Type 2 diabetes mellitus without complications: Secondary | ICD-10-CM | POA: Diagnosis not present

## 2017-09-05 DIAGNOSIS — F17200 Nicotine dependence, unspecified, uncomplicated: Secondary | ICD-10-CM | POA: Diagnosis not present

## 2017-09-05 DIAGNOSIS — X58XXXA Exposure to other specified factors, initial encounter: Secondary | ICD-10-CM | POA: Insufficient documentation

## 2017-09-05 MED ORDER — ACETAMINOPHEN 500 MG PO TABS
1000.0000 mg | ORAL_TABLET | Freq: Once | ORAL | Status: AC
  Administered 2017-09-05: 1000 mg via ORAL
  Filled 2017-09-05: qty 2

## 2017-09-05 MED ORDER — CYCLOBENZAPRINE HCL 10 MG PO TABS: 10.0000 mg | ORAL_TABLET | Freq: Two times a day (BID) | ORAL | 0 refills | Status: DC | PRN

## 2017-09-05 MED FILL — CYCLOBENZAPRINE 10 MG TAB: 10 | 4 days supply | Qty: 8 | Fill #0

## 2017-09-05 NOTE — ED Notes (Signed)
Patient verbalized understanding of discharge instructions. Patient denies any further requests. 

## 2017-09-05 NOTE — ED Notes (Signed)
ED Provider at bedside. 

## 2017-09-05 NOTE — Discharge Instructions (Signed)
Follow-up with your primary care doctor in the next 24-48 hour for further evaluation.  You can take Tylenol or Ibuprofen as directed for pain. You can alternate Tylenol and Ibuprofen every 4 hours. If you take Tylenol at 1pm, then you can take Ibuprofen at 5pm. Then you can take Tylenol again at 9pm. Do not take more than 4000 mg of Tylenol a day.   Take Flexeril as prescribed. This medication will make you drowsy so do not drive or drink alcohol when taking it.    Return to the Emergency Department immediately for any worsening back pain, neck pain, difficulty walking, numbness/weaknss of your arms or legs, urinary or bowel accidents, fever or any other worsening or concerning symptoms.    Back Pain:   Your back pain should be treated with medicines such as ibuprofen or aleve and this back pain should get better over the next 2 weeks.  However if you develop severe or worsening pain, low back pain with fever, numbness, weakness or inability to walk or urinate, you should return to the ER immediately.    Please follow up with your doctor this week for a recheck if still having symptoms . Low back pain is discomfort in the lower back that may be due to injuries to muscles and ligaments around the spine.  Occasionally, it may be caused by a a problem to a part of the spine called a disc.  The pain may last several days or a week;  However, most patients get completely well in 4 weeks.  Self - care:  The application of heat can help soothe the pain.  Maintaining your daily activities, including walking, is encourged, as it will help you get better faster than just staying in bed.  Medications are also useful to help with pain control.  A commonly prescribed medications includes acetaminophen.  This medication is generally safe, though you should not take more than 8 of the extra strength ( ) pills a day.  Non steroidal anti inflammatory medications including Ibuprofen and naproxen;  These  medications help both pain and swelling and are very useful in treating back pain.  They should be taken with food, as they can cause stomach upset, and more seriously, stomach bleeding.    Muscle relaxants:  These medications can help with muscle tightness that is a cause of lower back pain.  Most of these medications can cause drowsiness, and it is not safe to drive or use dangerous machinery while taking them.

## 2017-09-05 NOTE — ED Triage Notes (Signed)
Patient reports that he slept on an unusual bed about 2 weeks ago. The patient reports that he has had pain x 2 weeks since. The patient has slurred speech in triage and a cough

## 2017-09-05 NOTE — ED Provider Notes (Signed)
[facility] Provider [facility]   CSN: [id number] Arrival date & time: 09/05/17  1200     History   Chief Complaint Chief Complaint  Patient presents with  . Back Pain    HPI Tony Patel. is a 51 y.o. male who presents with 2 weeks of intermittent right-sided back pain. Patient reports that pain started 2 weeks ago after he was sleeping on a sofa bed wrong. He reports that since then he has had diffuse muscular pain to the right paraspinal muscles of the thoracic region. Patient reports that he has been taking Vicodin for symptomatic relief. Patient reports that he was previously prescribed Vicodin from his primary care doctor for a pinched nerve. Patient reports that his doctor moved away and he has not been able see him. Patient states that he has been referred to pain management but has not been able to see them yet and he is all out of pain medication. Preceding trauma, injury, fall. Patient has not taking any other medications for pain. Patient reports that pain is worsened with movement. Patient has been able in delay without any difficulty. Denies fevers, weight loss, numbness/weakness of upper and lower extremities, bowel/bladder incontinence, saddle anesthesia, history of back surgery, history of IVDA. Patient denies any chest pain, SOB, nausea/vomiting, abdominal pain.  The history is provided by the patient.    Past Medical History:  Diagnosis Date  . Back pain   . Diabetes mellitus without complication (HCC)   . Sciatica     There are no active problems to display for this patient.   History reviewed. No pertinent surgical history.     Home Medications    Prior to Admission medications   Medication Sig Start Date End Date Taking? Authorizing Provider  clindamycin (CLEOCIN) 300 MG capsule Take 1 capsule (300 mg total) by mouth 3 (three) times daily. 07/02/17   Rancour, Jeannett Senior, MD  cyclobenzaprine (FLEXERIL) 10 MG tablet Take 1 tablet (10 mg total) by  mouth 2 (two) times daily as needed for muscle spasms. 09/05/17   Maxwell Caul, PA-C  HYDROcodone-acetaminophen (NORCO) 5-325 MG tablet Take 1-2 tablets by mouth every 4 (four) hours as needed for severe pain. 01/30/17   Loren Racer, MD  ibuprofen (ADVIL,MOTRIN) 800 MG tablet Take 1 tablet (800 mg total) by mouth every 8 (eight) hours as needed. 10/10/16   Lawyer, Cristal Deer, PA-C  METFORMIN HCL PO Take 1,000 mg by mouth 2 (two) times daily.     [provider]  traMADol (ULTRAM) 50 MG tablet Take 1 tablet (50 mg total) by mouth every 6 (six) hours as needed for severe pain. 10/10/16   Charlestine Night, PA-C    Family History History reviewed. No pertinent family history.  Social History Social History  Substance Use Topics  . Smoking status: Current Every Day Smoker    Packs/day: 0.50  . Smokeless tobacco: Never Used  . Alcohol use 0.0 oz/week     Comment: weekly     Allergies   Patient has no known allergies.   Review of Systems Review of Systems  Constitutional: Negative for fever and unexpected weight change.  Musculoskeletal: Positive for back pain.  Skin: Negative for color change.  Neurological: Negative for weakness and numbness.     Physical Exam Updated Vital Signs BP (!) 130/94 (BP Location: Right Arm)   Pulse 70   Temp 98.1 F (36.7 C) (Oral)   Resp 16   Ht  (1.778 m)   Wt  93 kg (205 lb)   SpO2 99%   BMI 29.41 kg/m   Physical Exam  Constitutional: He is oriented to person, place, and time. He appears well-developed and well-nourished.  HENT:  Head: Normocephalic and atraumatic.  Mouth/Throat: Oropharynx is clear and moist and mucous membranes are normal.  Eyes: Pupils are equal, round, and reactive to light. Conjunctivae, EOM and lids are normal.  Neck: Full passive range of motion without pain.  Full flexion/extension and lateral movement of neck fully intact. No bony midline tenderness. No deformities or crepitus.     Cardiovascular: Normal rate, regular rhythm, normal heart sounds and normal pulses.  Exam reveals no gallop and no friction rub.   No murmur heard. Pulmonary/Chest: Effort normal and breath sounds normal. He has no decreased breath sounds.  No evidence of respiratory distress. Able to speak in full sentences without difficulty. No tenderness palpation to the anterior posterior chest wall. No crepitus or deformity noted.  Abdominal: Soft. Normal appearance. There is no tenderness. There is no rigidity and no guarding.  Musculoskeletal: Normal range of motion.       Thoracic back: He exhibits no tenderness.       Lumbar back: He exhibits no tenderness.       Back:  Diffuse muscular tenderness and tension overlying the right paraspinal muscles of the thoracic region of the trapezius muscle. No midline tenderness. No deformity or crepitus. No tenderness palpation to bilateral clavicles, shoulders. No deformity or crepitus noted. No overlying warmth, erythema, ecchymosis noted to the bilateral shoulders.  Neurological: He is alert and oriented to person, place, and time.  Follows commands, Moves all extremities  5/5 strength to BUE and BLE  Sensation intact throughout all major nerve distributions Normal gait  Skin: Skin is warm and dry. Capillary refill takes less than 2 seconds.  Psychiatric: He has a normal mood and affect. His speech is normal.  Nursing note and vitals reviewed.    ED Treatments / Results  Labs (all labs ordered are listed, but only abnormal results are displayed) Labs Reviewed - No data to display  EKG  EKG Interpretation None       Radiology Dg Shoulder Right  Result Date: 09/05/2017 CLINICAL DATA:  Right shoulder pain.  No injury. EXAM: RIGHT SHOULDER - 2+ VIEW COMPARISON:  None. FINDINGS: No acute fracture or malalignment. Moderate acromioclavicular degenerative changes. The glenohumeral joint space is preserved. Prominent deltoid tuberosity along the  lateral proximal humerus. Visualized right lung is clear. IMPRESSION: Moderate acromioclavicular degenerative changes. No acute osseous abnormality. Electronically Signed   By: Obie Dredge M.D.   On: 09/05/2017 12:26    Procedures Procedures (including critical care time)  Medications Ordered in ED Medications  acetaminophen (TYLENOL) tablet 1,000 mg (1,000 mg Oral Given 09/05/17 1400)     Initial Impression / Assessment and Plan / ED Course  I have reviewed the triage vital signs and the nursing notes.  Pertinent labs & imaging results that were available during my care of the patient were reviewed by me and considered in my medical decision making (see chart for details).     51 year old male who presents with 2 weeks of right-sided back pain after sleeping on the bed wrong. No other preceding trauma, injury, fall. Patient is afebrile, non-toxic appearing, sitting comfortably on examination table. Vital signs reviewed and stable. No red flag symptoms and history or neuro deficits on exam. Physical exam shows diffuse paraspinal tenderness in the right-sided paraspinal muscles of the thoracic  region. No midline tenderness. History/physical exam are not concerning for cauda equina or spinal abscess. Do not suspect rib fracture or pneumothorax given history/physical exam. Consider muscular strain. Patient initially presented with shoulder pain and had a shoulder x-ray inserted at triage. On my evaluation, patient denies any shoulder pain. He has no tenderness or deformity to the right shoulder. He has been getting hydrocodone prescriptions for pinched nerve by his primary care doctor who recently moved. He has been referred to pain management but has not seen him yet and states that he thought of his pain medications. Suspect that there is a drug-seeking component to patient's complaints.  View patient on the [facility] substance database. Patient has multiple narcotic prescriptions in the  last year. Most recently he got a #20 prescription for hydrocodone on 08/07/17. He also received a#15 prescription for hydrocodone on 08/14/17.  Encourage patient on conservative therapies at home. Plan to provide short course of muscle relaxer to help with pain. Advised not to take a muscle relaxer at the same time as any pain medication. Instructed him to follow up with the referred pain management group for further evaluation. Provided patient with a list of clinic resources to use if he does not have a PCP. Instructed to call them today to arrange follow-up in the next 24-48 hours. Strict return precautions discussed. Patient expresses understanding and agreement to plan.    Final Clinical Impressions(s) / ED Diagnoses   Final diagnoses:  Muscle strain  Acute right-sided thoracic back pain    New Prescriptions Discharge Medication List as of 09/05/2017  2:03 PM       Maxwell Caul, PA-C 09/05/17 1701    Tilden Fossa, MD 09/07/17 1455

## 2018-01-15 DIAGNOSIS — F17200 Nicotine dependence, unspecified, uncomplicated: Secondary | ICD-10-CM | POA: Insufficient documentation

## 2018-07-08 ENCOUNTER — Encounter (HOSPITAL_BASED_OUTPATIENT_CLINIC_OR_DEPARTMENT_OTHER): Payer: Self-pay | Admitting: Emergency Medicine

## 2018-07-08 ENCOUNTER — Emergency Department (HOSPITAL_BASED_OUTPATIENT_CLINIC_OR_DEPARTMENT_OTHER)
Admission: EM | Admit: 2018-07-08 | Discharge: 2018-07-08 | Disposition: A | Discharge status: Home / Self Care | Payer: Medicaid Other | Attending: Emergency Medicine | Admitting: Emergency Medicine

## 2018-07-08 ENCOUNTER — Other Ambulatory Visit: Payer: Self-pay

## 2018-07-08 DIAGNOSIS — E119 Type 2 diabetes mellitus without complications: Secondary | ICD-10-CM | POA: Insufficient documentation

## 2018-07-08 DIAGNOSIS — F17200 Nicotine dependence, unspecified, uncomplicated: Secondary | ICD-10-CM | POA: Diagnosis not present

## 2018-07-08 DIAGNOSIS — L02411 Cutaneous abscess of right axilla: Secondary | ICD-10-CM

## 2018-07-08 DIAGNOSIS — L089 Local infection of the skin and subcutaneous tissue, unspecified: Secondary | ICD-10-CM

## 2018-07-08 DIAGNOSIS — R21 Rash and other nonspecific skin eruption: Secondary | ICD-10-CM | POA: Diagnosis present

## 2018-07-08 DIAGNOSIS — Z7984 Long term (current) use of oral hypoglycemic drugs: Secondary | ICD-10-CM | POA: Insufficient documentation

## 2018-07-08 MED ORDER — CLINDAMYCIN HCL 150 MG PO CAPS: 450.0000 mg | ORAL_CAPSULE | Freq: Three times a day (TID) | ORAL | 0 refills | Status: DC

## 2018-07-08 MED ORDER — PROBIOTIC 250 MG PO CAPS: 1.0000 | ORAL_CAPSULE | Freq: Every day | ORAL | 0 refills | Status: DC

## 2018-07-08 MED FILL — CLINDAMYCIN HCL 150 MG CAPS: 150 | 7 days supply | Qty: 63 | Fill #0

## 2018-07-08 MED FILL — ACIDOPHILUS PROBIOTIC TAB: 0.5 | 120 days supply | Qty: 120 | Fill #0

## 2018-07-08 NOTE — ED Triage Notes (Addendum)
Rash to right axilla x 1 week.  [place] is [place] if this is what caused it.  Multiple abscess noted to right axilla.

## 2018-07-08 NOTE — ED Notes (Signed)
Pt verbalized understanding of dc instructions.

## 2018-07-08 NOTE — ED Provider Notes (Signed)
[facility] EMERGENCY DEPARTMENT Provider Note   CSN: [id number] Arrival date & time: 07/08/18  1056     History   Chief Complaint Chief Complaint  Patient presents with  . Rash    HPI Tony Patel. is a 52 y.o. male with history of diabetes, back pain, sciatica who presents with a one-week history of rash under right axilla.  Patient reports he has been swimming at [facility] that is dirty.  He reports the rash is painful.  He has history of abscesses in his groin and has had incision and drainage before.  He denies any fevers, nausea, vomiting.  He has not tried any new medications or interventions at home.  He reports taking his at home pain medication as scheduled with some relief.  HPI  Past Medical History:  Diagnosis Date  . Back pain   . Diabetes mellitus without complication (HCC)   . Sciatica     There are no active problems to display for this patient.   History reviewed. No pertinent surgical history.      Home Medications    Prior to Admission medications   Medication Sig Start Date End Date Taking? Authorizing Provider  HYDROcodone-acetaminophen (NORCO) 7.5-325 MG tablet Take 1 tablet by mouth every 6 (six) hours as needed for moderate pain.   Yes [provider]  clindamycin (CLEOCIN) 150 MG capsule Take 3 capsules (450 mg total) by mouth 3 (three) times daily. 07/08/18   Cortez Steelman, Waylan Boga, PA-C  cyclobenzaprine (FLEXERIL) 10 MG tablet Take 1 tablet (10 mg total) by mouth 2 (two) times daily as needed for muscle spasms. 09/05/17   Maxwell Caul, PA-C  HYDROcodone-acetaminophen (NORCO) 5-325 MG tablet Take 1-2 tablets by mouth every 4 (four) hours as needed for severe pain. 01/30/17   Loren Racer, MD  ibuprofen (ADVIL,MOTRIN) 800 MG tablet Take 1 tablet (800 mg total) by mouth every 8 (eight) hours as needed. 10/10/16   Lawyer, Cristal Deer, PA-C  METFORMIN HCL PO Take 1,000 mg by mouth 2 (two) times daily.     [provider]    Saccharomyces boulardii (PROBIOTIC) 250 MG CAPS Take 1 capsule by mouth daily. 07/08/18   Siri Buege, Waylan Boga, PA-C  traMADol (ULTRAM) 50 MG tablet Take 1 tablet (50 mg total) by mouth every 6 (six) hours as needed for severe pain. 10/10/16   Charlestine Night, PA-C    Family History History reviewed. No pertinent family history.  Social History Social History   Tobacco Use  . Smoking status: Current Every Day Smoker    Packs/day: 0.50  . Smokeless tobacco: Never Used  Substance Use Topics  . Alcohol use: Yes    Comment: weekly  . Drug use: No     Allergies   Patient has no known allergies.   Review of Systems Review of Systems  Constitutional: Negative for fever.  Skin: Positive for rash and wound.     Physical Exam Updated Vital Signs BP 126/86 (BP Location: Left Arm)   Pulse 88   Temp (!) 97.5 F (36.4 C) (Oral)   Resp 16   Ht 5\' 10"  (1.778 m)   Wt 90.7 kg (200 lb)   SpO2 99%   BMI 28.70 kg/m   Physical Exam  Constitutional: He appears well-developed and well-nourished. No distress.  HENT:  Head: Normocephalic and atraumatic.  Mouth/Throat: Oropharynx is clear and moist. No oropharyngeal exudate.  Eyes: Pupils are equal, round, and reactive to light. Conjunctivae are normal. Right eye  exhibits no discharge. Left eye exhibits no discharge. No scleral icterus.  Neck: Normal range of motion. Neck supple. No thyromegaly present.  Cardiovascular: Normal rate, regular rhythm, normal heart sounds and intact distal pulses. Exam reveals no gallop and no friction rub.  No murmur heard. Pulmonary/Chest: Effort normal and breath sounds normal. No stridor. No respiratory distress. He has no wheezes. He has no rales.  Musculoskeletal: He exhibits no edema.  Lymphadenopathy:    He has no cervical adenopathy.  Neurological: He is alert. Coordination normal.  Skin: Skin is warm and dry. No rash noted. He is not diaphoretic. No pallor.  Multiple abscesses and areas of  induration about the right axilla, some superficial, some with induration, see photo  Psychiatric: He has a normal mood and affect.  Nursing note and vitals reviewed.      ED Treatments / Results  Labs (all labs ordered are listed, but only abnormal results are displayed) Labs Reviewed - No data to display  EKG None  Radiology No results found.  Procedures .Marland KitchenIncision and Drainage Date/Time: 07/08/2018 12:04 PM Performed by: Emi Holes, PA-C Authorized by: Emi Holes, PA-C   Consent:    Consent given by:  Patient   Risks discussed:  Bleeding, incomplete drainage and pain Location:    Type:  Abscess   Size:  <1cm   Location:  Upper extremity   Upper extremity location: Axilla (R) Pre-procedure details:    Skin preparation:  Chloraprep Anesthesia (see MAR for exact dosages):    Anesthesia method:  None Procedure type:    Complexity:  Simple Procedure details:    Needle aspiration: no     Incision types:  Stab incision   Incision depth:  Dermal   Scalpel blade:  11   Drainage:  Purulent and bloody   Drainage amount:  Moderate   Wound treatment:  Wound left open   Packing materials:  None Post-procedure details:    Patient tolerance of procedure:  Tolerated well, no immediate complications  EMERGENCY DEPARTMENT US SOFT TISSUE INTERPRETATION "Study: Limited Soft Tissue Ultrasound"  INDICATIONS: Soft tissue infection Multiple views of the body part were obtained in real-time with a multi-frequency linear probe  PERFORMED BY: Myself IMAGES ARCHIVED?: Yes SIDE:Right  BODY PART:Axilla INTERPRETATION:  Abcess present and Cellulitis present      (including critical care time)   Medications Ordered in ED Medications - No data to display   Initial Impression / Assessment and Plan / ED Course  I have reviewed the triage vital signs and the nursing notes.  Pertinent labs & imaging results that were available during my care of the patient were  reviewed by me and considered in my medical decision making (see chart for details).     Patient with multiple axillary abscesses.  One was drained with chest pressure with significant purulence.  The superficial wounds were lanced with an 18-gauge needle.  1 of the fluctuant areas was lanced with a stab incision with 11 blade.  Will treat with clindamycin.  Patient advised supportive treatment with warm compresses.  Patient patient advised to return in 2 days for wound check.  [place]  Return precautions sooner discussed.  Patient understands and agrees with plan.  Patient vitals stable throughout ED course and discharged in satisfactory condition. I discussed patient case with Dr. Madilyn Hook who guided the patient's management and agrees with plan.   Final Clinical Impressions(s) / ED Diagnoses  Final diagnoses:  Skin infection  Abscess of axilla, right    ED Discharge Orders        Ordered    clindamycin (CLEOCIN) 150 MG capsule  3 times daily     07/08/18 1156    Saccharomyces boulardii (PROBIOTIC) 250 MG CAPS  Daily     07/08/18 [address]1156       Alpa Salvo M, PA-C 07/08/18 1205    Tilden Fossaees, Elizabeth, MD 07/08/18 1531

## 2018-07-08 NOTE — Discharge Instructions (Signed)
Take clindamycin as prescribed until completed.  Take probiotic with this medication daily, as it can cause stomach upset.  Make sure to take with food.  Please return in 2 days for wound recheck.  Please avoid any dirty water while these are healing.  Use warm compresses 3-4 times daily to help bring the abscesses to ahead.  Please return to emergency department sooner if you believe to be any worsening pain, redness, swelling, red streaking from the area, fever.

## 2018-07-19 ENCOUNTER — Encounter (HOSPITAL_BASED_OUTPATIENT_CLINIC_OR_DEPARTMENT_OTHER): Payer: Self-pay

## 2018-07-19 ENCOUNTER — Emergency Department (HOSPITAL_BASED_OUTPATIENT_CLINIC_OR_DEPARTMENT_OTHER)
Admission: EM | Admit: 2018-07-19 | Discharge: 2018-07-19 | Disposition: A | Discharge status: Home / Self Care | Payer: Medicaid Other | Attending: Emergency Medicine | Admitting: Emergency Medicine

## 2018-07-19 ENCOUNTER — Other Ambulatory Visit: Payer: Self-pay

## 2018-07-19 DIAGNOSIS — E119 Type 2 diabetes mellitus without complications: Secondary | ICD-10-CM | POA: Diagnosis not present

## 2018-07-19 DIAGNOSIS — Z7984 Long term (current) use of oral hypoglycemic drugs: Secondary | ICD-10-CM | POA: Diagnosis not present

## 2018-07-19 DIAGNOSIS — R21 Rash and other nonspecific skin eruption: Secondary | ICD-10-CM | POA: Diagnosis not present

## 2018-07-19 DIAGNOSIS — F17200 Nicotine dependence, unspecified, uncomplicated: Secondary | ICD-10-CM | POA: Insufficient documentation

## 2018-07-19 MED ORDER — CLINDAMYCIN HCL 150 MG PO CAPS: 450.0000 mg | ORAL_CAPSULE | Freq: Three times a day (TID) | ORAL | 0 refills | Status: AC

## 2018-07-19 MED ORDER — PROBIOTIC 250 MG PO CAPS: 1.0000 | ORAL_CAPSULE | Freq: Every day | ORAL | 0 refills | Status: DC

## 2018-07-19 NOTE — Discharge Instructions (Signed)
Take antibiotics as directed. Please take all of your antibiotics until finished.  Take the probiotics as directed.  Apply warm compresses to the area to help with drainage.  Return to emergency department any worsening rash, worsening swelling or redness to the area, fevers or any other worsening or concerning symptoms.

## 2018-07-19 NOTE — ED Triage Notes (Signed)
Pt has a rash on his trunk. Pt was seen x2 weeks ago for same and given antibiotics. Rash remains. [place] end[place].

## 2018-07-19 NOTE — ED Provider Notes (Signed)
[facility] EMERGENCY DEPARTMENT Provider Note   CSN: [id number] Arrival date & time: 07/19/18  1303     History   Chief Complaint Chief Complaint  Patient presents with  . Rash    HPI Tony SaxonCurtis Abe Jr. is a 52 y.o. male personal history of back pain, diabetes, sciatica who presents for evaluation of rash.  Was seen on 07/08/2018 for evaluation of same rash to right axilla region.  He states the rash began [place]  Patient reports that he had multiple areas that were drained last time he was here.  Was discharged home on clindamycin, which he states he has been taking.  Patient reports that overall the rash has improved but is still there.  He denies any pruritus or pain with the rash.  Patient states he has not had any fevers.   The history is provided by the patient.    Past Medical History:  Diagnosis Date  . Back pain   . Diabetes mellitus without complication (HCC)   . Sciatica     There are no active problems to display for this patient.   History reviewed. No pertinent surgical history.      Home Medications    Prior to Admission medications   Medication Sig Start Date End Date Taking? Authorizing Provider  clindamycin (CLEOCIN) 150 MG capsule Take 3 capsules (450 mg total) by mouth 3 (three) times daily for 5 days. 07/19/18 07/24/18  Maxwell CaulLayden, Kyrillos Adams A, PA-C  cyclobenzaprine (FLEXERIL) 10 MG tablet Take 1 tablet (10 mg total) by mouth 2 (two) times daily as needed for muscle spasms. 09/05/17   Maxwell CaulLayden, Wendell Nicoson A, PA-C  HYDROcodone-acetaminophen (NORCO) 5-325 MG tablet Take 1-2 tablets by mouth every 4 (four) hours as needed for severe pain. 01/30/17   Loren RacerYelverton, David, MD  HYDROcodone-acetaminophen (NORCO) 7.5-325 MG tablet Take 1 tablet by mouth every 6 (six) hours as needed for moderate pain.    [provider]  ibuprofen (ADVIL,MOTRIN) 800 MG tablet Take 1 tablet (800 mg total) by mouth every 8 (eight) hours as needed.  10/10/16   Lawyer, Cristal Deerhristopher, PA-C  METFORMIN HCL PO Take 1,000 mg by mouth 2 (two) times daily.     [provider]  Saccharomyces boulardii (PROBIOTIC) 250 MG CAPS Take 1 capsule by mouth daily. 07/19/18   Maxwell CaulLayden, Brooklynn Brandenburg A, PA-C  traMADol (ULTRAM) 50 MG tablet Take 1 tablet (50 mg total) by mouth every 6 (six) hours as needed for severe pain. 10/10/16   Charlestine NightLawyer, Christopher, PA-C    Family History No family history on file.  Social History Social History   Tobacco Use  . Smoking status: Current Every Day Smoker    Packs/day: 0.50  . Smokeless tobacco: Never Used  Substance Use Topics  . Alcohol use: Yes    Comment: weekly  . Drug use: No     Allergies   Patient has no known allergies.   Review of Systems Review of Systems  Constitutional: Negative for fever.  Skin: Positive for rash.     Physical Exam Updated Vital Signs BP 115/85   Pulse 73   Temp 97.6 F (36.4 C) (Oral)   Resp 18   Ht 5\' 10"  (1.778 m)   Wt 90.7 kg (200 lb)   SpO2 100%   BMI 28.70 kg/m   Physical Exam  Constitutional: He appears well-developed and well-nourished.  HENT:  Head: Normocephalic and atraumatic.  Eyes: Conjunctivae and EOM are normal.  Right eye exhibits no discharge. Left eye exhibits no discharge. No scleral icterus.  Pulmonary/Chest: Effort normal.  Neurological: He is alert.  Skin: Skin is warm and dry. Capillary refill takes less than 2 seconds.  Multiple, small scattered papules noted to right axilla.  There is one on the proximal axilla that appear slightly erythematous with some slight yellow, crusting drainage.  No fluctuance noted.  Psychiatric: He has a normal mood and affect. His speech is normal and behavior is normal.  Nursing note and vitals reviewed.    ED Treatments / Results  Labs (all labs ordered are listed, but only abnormal results are displayed) Labs Reviewed - No data to display  EKG None  Radiology No results  found.  Procedures Procedures (including critical care time)  Medications Ordered in ED Medications - No data to display   Initial Impression / Assessment and Plan / ED Course  I have reviewed the triage vital signs and the nursing notes.  Pertinent labs & imaging results that were available during my care of the patient were reviewed by me and considered in my medical decision making (see chart for details).     52 year old male who presents for evaluation of rash.  Seen here on 07/08/18 for evaluation of same rash.  Had I&D's at that time and was prescribed clindamycin.  Reports improvement in the rash but states it still slightly persistent.  No fevers. Patient is afebrile, non-toxic appearing, sitting comfortably on examination table. Vital signs reviewed and stable.  On exam, he has small scattered papules noted to the right axilla.  There is one that is slightly erythematous with some yellow crusting drainage.  No purulent drainage, no fluctuance that require I&D here in the department.  Review of his previous records show a picture of his rash on 07/08/18.  Overall it is greatly improved.  His papules have significantly decreased in size and quantity.  He still has a few remaining.  On exam today, he has no evidence of abscess that require I&D here in the department.  We will plan to give him a additional short course of antibiotics to help with further resolution of symptoms.  Encourage at home supportive care, including warm compresses. Patient had ample opportunity for questions and discussion. All patient's questions were answered with full understanding. Strict return precautions discussed. Patient expresses understanding and agreement to plan.   Final Clinical Impressions(s) / ED Diagnoses   Final diagnoses:  Rash    ED Discharge Orders        Ordered    clindamycin (CLEOCIN) 150 MG capsule  3 times daily     07/19/18 1554    Saccharomyces boulardii (PROBIOTIC) 250 MG CAPS   Daily     07/19/18 1554       Rosana Hoes 07/19/18 1920    Charlynne Pander, MD 07/20/18 1504

## 2018-09-04 ENCOUNTER — Emergency Department (HOSPITAL_BASED_OUTPATIENT_CLINIC_OR_DEPARTMENT_OTHER)
Admission: EM | Admit: 2018-09-04 | Discharge: 2018-09-04 | Disposition: A | Discharge status: Home / Self Care | Payer: Medicaid Other | Attending: Emergency Medicine | Admitting: Emergency Medicine

## 2018-09-04 ENCOUNTER — Encounter (HOSPITAL_BASED_OUTPATIENT_CLINIC_OR_DEPARTMENT_OTHER): Payer: Self-pay | Admitting: Emergency Medicine

## 2018-09-04 ENCOUNTER — Other Ambulatory Visit: Payer: Self-pay

## 2018-09-04 DIAGNOSIS — H6121 Impacted cerumen, right ear: Secondary | ICD-10-CM | POA: Diagnosis not present

## 2018-09-04 DIAGNOSIS — F17210 Nicotine dependence, cigarettes, uncomplicated: Secondary | ICD-10-CM | POA: Insufficient documentation

## 2018-09-04 DIAGNOSIS — Z7984 Long term (current) use of oral hypoglycemic drugs: Secondary | ICD-10-CM | POA: Diagnosis not present

## 2018-09-04 DIAGNOSIS — E119 Type 2 diabetes mellitus without complications: Secondary | ICD-10-CM | POA: Insufficient documentation

## 2018-09-04 DIAGNOSIS — H9201 Otalgia, right ear: Secondary | ICD-10-CM | POA: Diagnosis present

## 2018-09-04 NOTE — ED Triage Notes (Signed)
Reports right ear fullness x 1 week.

## 2018-09-04 NOTE — ED Provider Notes (Signed)
[facility] EMERGENCY DEPARTMENT Provider Note   CSN: [id number] Arrival date & time: 09/04/18  1884     History   Chief Complaint Chief Complaint  Patient presents with  . Otalgia    HPI Tony Patel. is a 52 y.o. male.  HPI   Patient is a 52 year old male with a history of back pain, T2DM, sciatica who presents emergency department today for evaluation of right-sided ear fullness that has been present for the last 4 to 5 days.  Patient denies significant pain just states that he has fullness and decreased hearing on the right side.  No fevers or chills.  Does report associated rhinorrhea, nasal congestion.  No sore throat.  States that he occasionally uses Q-tips.  Symptoms are constant in nature.  Not resolved by putting hydrogen peroxide in his ears or using Q-tips.  Denies exacerbating or alleviating factors.  Past Medical History:  Diagnosis Date  . Back pain   . Diabetes mellitus without complication (HCC)   . Sciatica     There are no active problems to display for this patient.   History reviewed. No pertinent surgical history.      Home Medications    Prior to Admission medications   Medication Sig Start Date End Date Taking? Authorizing Provider  cyclobenzaprine (FLEXERIL) 10 MG tablet Take 1 tablet (10 mg total) by mouth 2 (two) times daily as needed for muscle spasms. 09/05/17   Maxwell Caul, PA-C  HYDROcodone-acetaminophen (NORCO) 5-325 MG tablet Take 1-2 tablets by mouth every 4 (four) hours as needed for severe pain. 01/30/17   Loren Racer, MD  HYDROcodone-acetaminophen (NORCO) 7.5-325 MG tablet Take 1 tablet by mouth every 6 (six) hours as needed for moderate pain.    [provider]  ibuprofen (ADVIL,MOTRIN) 800 MG tablet Take 1 tablet (800 mg total) by mouth every 8 (eight) hours as needed. 10/10/16   Lawyer, Cristal Deer, PA-C  METFORMIN HCL PO Take 1,000 mg by mouth 2 (two) times daily.     [provider]    Saccharomyces boulardii (PROBIOTIC) 250 MG CAPS Take 1 capsule by mouth daily. 07/19/18   Maxwell Caul, PA-C  traMADol (ULTRAM) 50 MG tablet Take 1 tablet (50 mg total) by mouth every 6 (six) hours as needed for severe pain. 10/10/16   Charlestine Night, PA-C    Family History History reviewed. No pertinent family history.  Social History Social History   Tobacco Use  . Smoking status: Current Every Day Smoker    Packs/day: 0.50  . Smokeless tobacco: Never Used  Substance Use Topics  . Alcohol use: Yes    Comment: weekly  . Drug use: No     Allergies   Patient has no known allergies.   Review of Systems Review of Systems  Constitutional: Negative for fever.  HENT: Positive for congestion and rhinorrhea. Negative for sore throat.        Right rear fullness and decreased hearing  Respiratory: Negative for shortness of breath.   Cardiovascular: Negative for chest pain.  Gastrointestinal: Negative for abdominal pain.  Musculoskeletal: Negative for back pain.  Neurological: Negative for headaches.   Physical Exam Updated Vital Signs BP 121/88   Pulse 68   Temp 98.9 F (37.2 C) (Oral)   Resp 16   SpO2 98%   Physical Exam  Constitutional: He is oriented to person, place, and time. He appears well-developed and well-nourished. No distress.  HENT:  Head: Normocephalic and atraumatic.  Review of  canal and bilateral TMs are partially obstructed by hair and cerumen.  No pharyngeal erythema.  No tonsillar swelling or exudates.  Nose normal.  Eyes: Pupils are equal, round, and reactive to light. Conjunctivae and EOM are normal.  Neck: Neck supple.  Cardiovascular: Normal rate and regular rhythm.  Pulmonary/Chest: Effort normal and breath sounds normal.  Neurological: He is alert and oriented to person, place, and time.  Skin: Skin is warm and dry.     ED Treatments / Results  Labs (all labs ordered are listed, but only abnormal results are displayed) Labs  Reviewed - No data to display  EKG None  Radiology No results found.  Procedures Procedures (including critical care time)  Medications Ordered in ED Medications - No data to display   Initial Impression / Assessment and Plan / ED Course  I have reviewed the triage vital signs and the nursing notes.  Pertinent labs & imaging results that were available during my care of the patient were reviewed by me and considered in my medical decision making (see chart for details).      Final Clinical Impressions(s) / ED Diagnoses   Final diagnoses:  Impacted cerumen of right ear   Pt presenting with ear fullness x4-5 days. VSS. Afebrile. NAD. TM initially unable to be fully visualized. Nursing performed cerumen disimpaction by flushing the ear with normal saline.  There was a moderate amount of wax and hair that were cleaned from the ear.  There is also questionable piece of Q-tip in his ear as well.  Patient reports improvement after ear was flushed he states that his symptoms have resolved. Re-evaluated TM. TM is seated very deep to the canal and is still unable to be fully visualized but visualized portion appears opacified. No obvious erythema or effusion. Advised pt to f/u with pcp and return if worse. All questions answered. Pt stable for discharge.  ED Discharge Orders    None       Karrie MeresCouture, Kahne Helfand S, PA-C 09/04/18 1055    Virgina NorfolkCuratolo, Adam, DO 09/04/18 1620

## 2018-09-04 NOTE — Discharge Instructions (Signed)
Please follow up with your primary care provider within 5-7 days for re-evaluation of your symptoms. If you do not have a primary care provider, information for a healthcare clinic has been provided for you to make arrangements for follow up care. Please return to the emergency department for any new or worsening symptoms. ° °

## 2018-09-07 ENCOUNTER — Other Ambulatory Visit: Payer: Self-pay

## 2018-09-07 ENCOUNTER — Emergency Department (HOSPITAL_BASED_OUTPATIENT_CLINIC_OR_DEPARTMENT_OTHER)
Admission: EM | Admit: 2018-09-07 | Discharge: 2018-09-07 | Disposition: A | Discharge status: Home / Self Care | Payer: Medicaid Other | Attending: Emergency Medicine | Admitting: Emergency Medicine

## 2018-09-07 ENCOUNTER — Encounter (HOSPITAL_BASED_OUTPATIENT_CLINIC_OR_DEPARTMENT_OTHER): Payer: Self-pay | Admitting: Emergency Medicine

## 2018-09-07 DIAGNOSIS — R05 Cough: Secondary | ICD-10-CM | POA: Diagnosis present

## 2018-09-07 DIAGNOSIS — Z7984 Long term (current) use of oral hypoglycemic drugs: Secondary | ICD-10-CM | POA: Insufficient documentation

## 2018-09-07 DIAGNOSIS — F17200 Nicotine dependence, unspecified, uncomplicated: Secondary | ICD-10-CM | POA: Diagnosis not present

## 2018-09-07 DIAGNOSIS — J069 Acute upper respiratory infection, unspecified: Secondary | ICD-10-CM | POA: Diagnosis not present

## 2018-09-07 DIAGNOSIS — E119 Type 2 diabetes mellitus without complications: Secondary | ICD-10-CM | POA: Diagnosis not present

## 2018-09-07 DIAGNOSIS — B9789 Other viral agents as the cause of diseases classified elsewhere: Secondary | ICD-10-CM | POA: Diagnosis not present

## 2018-09-07 NOTE — ED Provider Notes (Signed)
[facility] EMERGENCY DEPARTMENT Provider Note   CSN: [id number] Arrival date & time: 09/07/18  0945     History   Chief Complaint Chief Complaint  Patient presents with  . Cough    HPI Tony Patel. is a 52 y.o. male.  The history is provided by the patient.  URI   This is a new problem. The current episode started 2 days ago. The problem has been gradually worsening. There has been no fever. Associated symptoms include congestion, rhinorrhea, sore throat and cough. Pertinent negatives include no chest pain, no abdominal pain, no nausea, no vomiting, no sinus pain, no neck pain and no wheezing. Associated symptoms comments: No SOB.  Son with similar sx.  Is a smoker. He has tried nothing for the symptoms. The treatment provided no relief.    Past Medical History:  Diagnosis Date  . Back pain   . Diabetes mellitus without complication (HCC)   . Sciatica     There are no active problems to display for this patient.   History reviewed. No pertinent surgical history.      Home Medications    Prior to Admission medications   Medication Sig Start Date End Date Taking? Authorizing Provider  cyclobenzaprine (FLEXERIL) 10 MG tablet Take 1 tablet (10 mg total) by mouth 2 (two) times daily as needed for muscle spasms. 09/05/17   Maxwell Caul, PA-C  HYDROcodone-acetaminophen (NORCO) 5-325 MG tablet Take 1-2 tablets by mouth every 4 (four) hours as needed for severe pain. 01/30/17   Loren Racer, MD  HYDROcodone-acetaminophen (NORCO) 7.5-325 MG tablet Take 1 tablet by mouth every 6 (six) hours as needed for moderate pain.    [provider]  ibuprofen (ADVIL,MOTRIN) 800 MG tablet Take 1 tablet (800 mg total) by mouth every 8 (eight) hours as needed. 10/10/16   Lawyer, Cristal Deer, PA-C  METFORMIN HCL PO Take 1,000 mg by mouth 2 (two) times daily.     [provider]  Saccharomyces boulardii (PROBIOTIC) 250 MG CAPS Take 1 capsule by mouth  daily. 07/19/18   Maxwell Caul, PA-C  traMADol (ULTRAM) 50 MG tablet Take 1 tablet (50 mg total) by mouth every 6 (six) hours as needed for severe pain. 10/10/16   Charlestine Night, PA-C    Family History No family history on file.  Social History Social History   Tobacco Use  . Smoking status: Current Every Day Smoker    Packs/day: 0.50  . Smokeless tobacco: Never Used  Substance Use Topics  . Alcohol use: Yes    Comment: weekly  . Drug use: No     Allergies   Patient has no known allergies.   Review of Systems Review of Systems  HENT: Positive for congestion, rhinorrhea and sore throat. Negative for sinus pain.   Respiratory: Positive for cough. Negative for wheezing.   Cardiovascular: Negative for chest pain.  Gastrointestinal: Negative for abdominal pain, nausea and vomiting.  Musculoskeletal: Negative for neck pain.  All other systems reviewed and are negative.    Physical Exam Updated Vital Signs BP 122/81 (BP Location: Left Arm)   Pulse 86   Temp 98.3 F (36.8 C) (Oral)   Resp 18   Ht 5\' 10"  (1.778 m)   Wt 90.7 kg   SpO2 98%   BMI 28.70 kg/m   Physical Exam  Constitutional: He is oriented to person, place, and time. He appears well-developed and well-nourished. No distress.  HENT:  Head: Normocephalic and atraumatic.  Mouth/Throat:  Oropharynx is clear and moist.  Eyes: Pupils are equal, round, and reactive to light. Conjunctivae and EOM are normal.  Neck: Normal range of motion. Neck supple.  Cardiovascular: Normal rate, regular rhythm and intact distal pulses.  No murmur heard. Pulmonary/Chest: Effort normal and breath sounds normal. No respiratory distress. He has no wheezes. He has no rales.  Intermittent coughing on exam  Abdominal: Soft. He exhibits no distension. There is no tenderness. There is no rebound and no guarding.  Musculoskeletal: Normal range of motion. He exhibits no edema or tenderness.  Neurological: He is alert and  oriented to person, place, and time.  Skin: Skin is warm and dry. No rash noted. No erythema.  Psychiatric: He has a normal mood and affect. His behavior is normal.  Nursing note and vitals reviewed.    ED Treatments / Results  Labs (all labs ordered are listed, but only abnormal results are displayed) Labs Reviewed - No data to display  EKG None  Radiology No results found.  Procedures Procedures (including critical care time)  Medications Ordered in ED Medications - No data to display   Initial Impression / Assessment and Plan / ED Course  I have reviewed the triage vital signs and the nursing notes.  Pertinent labs & imaging results that were available during my care of the patient were reviewed by me and considered in my medical decision making (see chart for details).     Pt with symptoms consistent with viral URI.  Well appearing here.  No signs of breathing difficulty  No signs of pharyngitis, otitis or abnormal abdominal findings.   CXR wnl and pt to return with any further problems.   Final Clinical Impressions(s) / ED Diagnoses   Final diagnoses:  Viral URI with cough    ED Discharge Orders    None       Gwyneth SproutPlunkett, Shritha Bresee, MD 09/07/18 1014

## 2018-09-07 NOTE — Discharge Instructions (Signed)
Drink gatorade, use nyquil or other cough/cold medication and get plenty of rest.  This should improve in the next 3-4 days.

## 2018-09-07 NOTE — ED Triage Notes (Signed)
Cough x 2 days

## 2018-12-20 ENCOUNTER — Emergency Department (HOSPITAL_BASED_OUTPATIENT_CLINIC_OR_DEPARTMENT_OTHER): Payer: Medicaid Other

## 2018-12-20 ENCOUNTER — Emergency Department (HOSPITAL_BASED_OUTPATIENT_CLINIC_OR_DEPARTMENT_OTHER)
Admission: EM | Admit: 2018-12-20 | Discharge: 2018-12-20 | Disposition: A | Discharge status: Home / Self Care | Payer: Medicaid Other | Attending: Emergency Medicine | Admitting: Emergency Medicine

## 2018-12-20 ENCOUNTER — Encounter (HOSPITAL_BASED_OUTPATIENT_CLINIC_OR_DEPARTMENT_OTHER): Payer: Self-pay

## 2018-12-20 ENCOUNTER — Other Ambulatory Visit: Payer: Self-pay

## 2018-12-20 DIAGNOSIS — L02512 Cutaneous abscess of left hand: Secondary | ICD-10-CM | POA: Diagnosis not present

## 2018-12-20 DIAGNOSIS — E119 Type 2 diabetes mellitus without complications: Secondary | ICD-10-CM | POA: Diagnosis not present

## 2018-12-20 DIAGNOSIS — F17200 Nicotine dependence, unspecified, uncomplicated: Secondary | ICD-10-CM | POA: Insufficient documentation

## 2018-12-20 MED ORDER — PROBIOTIC 250 MG PO CAPS: 1.0000 | ORAL_CAPSULE | Freq: Every day | ORAL | 0 refills | Status: DC

## 2018-12-20 MED ORDER — CLINDAMYCIN HCL 150 MG PO CAPS: 450.0000 mg | ORAL_CAPSULE | Freq: Three times a day (TID) | ORAL | 0 refills | Status: AC

## 2018-12-20 NOTE — Discharge Instructions (Signed)
Take clindamycin until completed.  Take probiotic with food once daily while taking this medication.  Please return in 2 days for wound recheck.  Please return sooner if you develop any increasing pain, redness, swelling, red streaking up your hand, fevers, or any other new or concerning symptoms.

## 2018-12-20 NOTE — ED Provider Notes (Signed)
[facility] EMERGENCY DEPARTMENT Provider Note   CSN: [id number] Arrival date & time: 12/20/18  0945     History   Chief Complaint Chief Complaint  Patient presents with  . Finger Injury    HPI Tony Patel. is a 53 y.o. male with history of diabetes, sciatica who presents with pain and swelling to his left middle finger after he hit it while moving a washer and dryer 1 week ago.  He has had a throbbing sensation to the area.  He is had associated redness.  He denies any drainage.  Patient denies any fevers.  He has full range of motion of the digit.  HPI  Past Medical History:  Diagnosis Date  . Back pain   . Diabetes mellitus without complication (HCC)   . Sciatica     There are no active problems to display for this patient.   History reviewed. No pertinent surgical history.      Home Medications    Prior to Admission medications   Medication Sig Start Date End Date Taking? Authorizing Provider  clindamycin (CLEOCIN) 150 MG capsule Take 3 capsules (450 mg total) by mouth 3 (three) times daily for 7 days. 12/20/18 12/27/18  Emi Holes, PA-C  cyclobenzaprine (FLEXERIL) 10 MG tablet Take 1 tablet (10 mg total) by mouth 2 (two) times daily as needed for muscle spasms. 09/05/17   Maxwell Caul, PA-C  HYDROcodone-acetaminophen (NORCO) 5-325 MG tablet Take 1-2 tablets by mouth every 4 (four) hours as needed for severe pain. 01/30/17   Loren Racer, MD  HYDROcodone-acetaminophen (NORCO) 7.5-325 MG tablet Take 1 tablet by mouth every 6 (six) hours as needed for moderate pain.    [provider]  ibuprofen (ADVIL,MOTRIN) 800 MG tablet Take 1 tablet (800 mg total) by mouth every 8 (eight) hours as needed. 10/10/16   Lawyer, Cristal Deer, PA-C  METFORMIN HCL PO Take 1,000 mg by mouth 2 (two) times daily.     [provider]  Saccharomyces boulardii (PROBIOTIC) 250 MG CAPS Take 1 capsule by mouth daily. 12/20/18   Tzipora Mcinroy, Waylan Boga, PA-C    traMADol (ULTRAM) 50 MG tablet Take 1 tablet (50 mg total) by mouth every 6 (six) hours as needed for severe pain. 10/10/16   Charlestine Night, PA-C    Family History No family history on file.  Social History Social History   Tobacco Use  . Smoking status: Current Every Day Smoker    Packs/day: 0.50  . Smokeless tobacco: Never Used  Substance Use Topics  . Alcohol use: Yes    Comment: weekly  . Drug use: No     Allergies   Patient has no known allergies.   Review of Systems Review of Systems  Constitutional: Negative for fever.  Musculoskeletal: Positive for joint swelling.  Skin: Positive for color change and wound.  Neurological: Negative for numbness.     Physical Exam Updated Vital Signs BP 139/90 (BP Location: Right Arm)   Pulse 95   Temp 98 F (36.7 C) (Oral)   Resp 16   Ht 5\' 10"  (1.778 m)   Wt 90.7 kg   SpO2 99%   BMI 28.70 kg/m   Physical Exam Vitals signs and nursing note reviewed.  Constitutional:      General: He is not in acute distress.    Appearance: He is well-developed. He is not diaphoretic.  HENT:     Head: Normocephalic and atraumatic.     Mouth/Throat:  Pharynx: No oropharyngeal exudate.  Eyes:     General: No scleral icterus.       Right eye: No discharge.        Left eye: No discharge.     Conjunctiva/sclera: Conjunctivae normal.     Pupils: Pupils are equal, round, and reactive to light.  Neck:     Musculoskeletal: Normal range of motion and neck supple.     Thyroid: No thyromegaly.  Cardiovascular:     Rate and Rhythm: Normal rate and regular rhythm.     Heart sounds: Normal heart sounds. No murmur. No friction rub. No gallop.   Pulmonary:     Effort: Pulmonary effort is normal. No respiratory distress.     Breath sounds: Normal breath sounds. No stridor. No wheezing or rales.  Abdominal:     General: Bowel sounds are normal. There is no distension.     Palpations: Abdomen is soft.     Tenderness: There is no  abdominal tenderness. There is no guarding or rebound.  Musculoskeletal:       Hands:  Lymphadenopathy:     Cervical: No cervical adenopathy.  Skin:    General: Skin is warm and dry.     Coloration: Skin is not pale.     Findings: No rash.  Neurological:     Mental Status: He is alert.     Coordination: Coordination normal.        ED Treatments / Results  Labs (all labs ordered are listed, but only abnormal results are displayed) Labs Reviewed - No data to display  EKG None  Radiology Dg Finger Middle Left  Result Date: 12/20/2018 CLINICAL DATA:  Trauma 1 week ago with redness and swelling. EXAM: LEFT MIDDLE FINGER 2+V COMPARISON:  None. FINDINGS: There is no evidence of fracture or dislocation. There is no evidence of arthropathy or other focal bone abnormality. Soft tissues are unremarkable. IMPRESSION: Negative. Electronically Signed   By: Paulina FusiMark  Shogry M.D.   On: 12/20/2018 10:42    Procedures .Marland Kitchen.Incision and Drainage Date/Time: 12/20/2018 11:36 AM Performed by: Emi HolesLaw, Florentina Marquart M, PA-C Authorized by: Emi HolesLaw, Caria Transue M, PA-C   Consent:    Consent obtained:  Verbal   Consent given by:  Patient   Risks discussed:  Bleeding, incomplete drainage and infection   Alternatives discussed:  No treatment Location:    Type:  Abscess   Size:  <1cm   Location:  Upper extremity   Upper extremity location:  Finger   Finger location:  L long finger Pre-procedure details:    Skin preparation:  Chloraprep Anesthesia (see MAR for exact dosages):    Anesthesia method:  None Procedure type:    Complexity:  Simple Procedure details:    Needle aspiration: no     Incision types:  Stab incision   Incision depth:  Dermal   Scalpel blade:  11   Wound management:  Irrigated with saline   Drainage:  Bloody and purulent   Drainage amount:  Scant   Wound treatment:  Wound left open   Packing materials:  None Post-procedure details:    Patient tolerance of procedure:  Tolerated well,  no immediate complications   (including critical care time)  Medications Ordered in ED Medications - No data to display   Initial Impression / Assessment and Plan / ED Course  I have reviewed the triage vital signs and the nursing notes.  Pertinent labs & imaging results that were available during my care of the patient were reviewed  by me and considered in my medical decision making (see chart for details).     Patient with skin abscess to the left long finger.  I unroofed the scab and there was scant purulent drainage.  I created a stab incision distal to the wound with no purulent drainage.  I also attempted further drainage in the eponychial fold, which resulted in only small amount of blood, no purulent drainage.  Warm soaks discussed.  Will initiate clindamycin.  Wound check in 2 days.  Sooner return precautions discussed.  Patient understands and agrees with plan.  Patient vital stable throughout ED course and discharged in satisfactory condition.  Final Clinical Impressions(s) / ED Diagnoses   Final diagnoses:  Abscess of finger of left hand    ED Discharge Orders         Ordered    clindamycin (CLEOCIN) 150 MG capsule  3 times daily     12/20/18 1120    Saccharomyces boulardii (PROBIOTIC) 250 MG CAPS  Daily,   Status:  Discontinued     12/20/18 1120    Saccharomyces boulardii (PROBIOTIC) 250 MG CAPS  Daily     12/20/18 1122           Emi HolesLaw, Canon Gola M, PA-C 12/20/18 1138    Melene PlanFloyd, Dan, DO 12/20/18 1539

## 2018-12-20 NOTE — ED Triage Notes (Signed)
Pt reports he hit left middle finger 1 week ago. (+) redness and swelling around nail bed

## 2019-02-09 ENCOUNTER — Other Ambulatory Visit: Payer: Self-pay

## 2019-02-09 ENCOUNTER — Emergency Department (HOSPITAL_BASED_OUTPATIENT_CLINIC_OR_DEPARTMENT_OTHER)
Admission: EM | Admit: 2019-02-09 | Discharge: 2019-02-09 | Disposition: A | Discharge status: Home / Self Care | Payer: Medicaid Other | Attending: Emergency Medicine | Admitting: Emergency Medicine

## 2019-02-09 ENCOUNTER — Encounter (HOSPITAL_BASED_OUTPATIENT_CLINIC_OR_DEPARTMENT_OTHER): Payer: Self-pay | Admitting: Emergency Medicine

## 2019-02-09 DIAGNOSIS — Z79899 Other long term (current) drug therapy: Secondary | ICD-10-CM | POA: Insufficient documentation

## 2019-02-09 DIAGNOSIS — R220 Localized swelling, mass and lump, head: Secondary | ICD-10-CM | POA: Diagnosis present

## 2019-02-09 DIAGNOSIS — E119 Type 2 diabetes mellitus without complications: Secondary | ICD-10-CM | POA: Insufficient documentation

## 2019-02-09 DIAGNOSIS — F17200 Nicotine dependence, unspecified, uncomplicated: Secondary | ICD-10-CM | POA: Diagnosis not present

## 2019-02-09 DIAGNOSIS — J340 Abscess, furuncle and carbuncle of nose: Secondary | ICD-10-CM | POA: Insufficient documentation

## 2019-02-09 MED ORDER — DOXYCYCLINE HYCLATE 100 MG PO CAPS: 100.0000 mg | ORAL_CAPSULE | Freq: Two times a day (BID) | ORAL | 0 refills | Status: DC

## 2019-02-09 MED ORDER — DOXYCYCLINE HYCLATE 100 MG PO TABS
100.0000 mg | ORAL_TABLET | Freq: Once | ORAL | Status: AC
  Administered 2019-02-09: 100 mg via ORAL
  Filled 2019-02-09: qty 1

## 2019-02-09 MED FILL — DOXYCYCLINE HYCLATE 100 MG: 100 | 7 days supply | Qty: 14 | Fill #0

## 2019-02-09 NOTE — ED Provider Notes (Signed)
[facility] EMERGENCY DEPARTMENT Provider Note   CSN: [id number] Arrival date & time: 02/09/19  1043    History   Chief Complaint Chief Complaint  Patient presents with  . swelling to nose    HPI Tony Freet. is a 53 y.o. male.     The history is provided by the patient.  Abscess  Location:  Face Facial abscess location:  Nose Abscess quality: induration, painful and redness   Red streaking: no   Duration:  2 days Progression:  Worsening Pain details:    Quality:  Sharp, pressure and throbbing   Severity:  Severe   Duration:  2 days   Timing:  Constant   Progression:  Worsening Chronicity:  New Context: diabetes   Context: not injected drug use and not insect bite/sting   Relieved by:  None tried Worsened by:  Draining/squeezing Ineffective treatments:  None tried Associated symptoms: no fever   Risk factors: no prior abscess     Past Medical History:  Diagnosis Date  . Back pain   . Diabetes mellitus without complication (HCC)   . Sciatica     There are no active problems to display for this patient.   History reviewed. No pertinent surgical history.      Home Medications    Prior to Admission medications   Medication Sig Start Date End Date Taking? Authorizing Provider  cyclobenzaprine (FLEXERIL) 10 MG tablet Take 1 tablet (10 mg total) by mouth 2 (two) times daily as needed for muscle spasms. 09/05/17   Maxwell Caul, PA-C  doxycycline (VIBRAMYCIN) 100 MG capsule Take 1 capsule (100 mg total) by mouth 2 (two) times daily. 02/09/19   [name], MD  HYDROcodone-acetaminophen (NORCO) 5-325 MG tablet Take 1-2 tablets by mouth every 4 (four) hours as needed for severe pain. 01/30/17   Loren Racer, MD  HYDROcodone-acetaminophen (NORCO) 7.5-325 MG tablet Take 1 tablet by mouth every 6 (six) hours as needed for moderate pain.    [provider]  ibuprofen (ADVIL,MOTRIN) 800 MG tablet Take 1 tablet (800 mg total) by  mouth every 8 (eight) hours as needed. 10/10/16   Lawyer, Cristal Deer, PA-C  METFORMIN HCL PO Take 1,000 mg by mouth 2 (two) times daily.     [provider]  Saccharomyces boulardii (PROBIOTIC) 250 MG CAPS Take 1 capsule by mouth daily. 12/20/18   Law, Waylan Boga, PA-C  traMADol (ULTRAM) 50 MG tablet Take 1 tablet (50 mg total) by mouth every 6 (six) hours as needed for severe pain. 10/10/16   Charlestine Night, PA-C    Family History No family history on file.  Social History Social History   Tobacco Use  . Smoking status: Current Every Day Smoker    Packs/day: 0.50  . Smokeless tobacco: Never Used  Substance Use Topics  . Alcohol use: Yes    Comment: occ  . Drug use: No     Allergies   Patient has no known allergies.   Review of Systems Review of Systems  Constitutional: Negative for fever.  All other systems reviewed and are negative.    Physical Exam Updated Vital Signs BP (!) 143/98 (BP Location: Right Arm)   Pulse (!) 104   Temp 98.2 F (36.8 C) (Oral)   Resp 16   Ht 5\' 10"  (1.778 m)   Wt 90.7 kg   SpO2 98%   BMI 28.70 kg/m   Physical Exam Vitals signs and nursing note reviewed.  Constitutional:  General: He is not in acute distress.    Appearance: He is well-developed.  HENT:     Head: Normocephalic and atraumatic.     Nose:      Mouth/Throat:     Mouth: No oral lesions.     Dentition: No gingival swelling, dental caries or dental abscesses.  Eyes:     Conjunctiva/sclera: Conjunctivae normal.     Pupils: Pupils are equal, round, and reactive to light.  Neck:     Musculoskeletal: Normal range of motion and neck supple.  Cardiovascular:     Rate and Rhythm: Normal rate.     Heart sounds: No murmur.  Pulmonary:     Effort: Pulmonary effort is normal. No respiratory distress.  Musculoskeletal: Normal range of motion.        General: No tenderness.  Skin:    General: Skin is warm and dry.     Findings: No erythema or rash.    Neurological:     Mental Status: He is alert and oriented to person, place, and time.  Psychiatric:        Behavior: Behavior normal.      ED Treatments / Results  Labs (all labs ordered are listed, but only abnormal results are displayed) Labs Reviewed - No data to display  EKG None  Radiology No results found.  Procedures Procedures (including critical care time)  Medications Ordered in ED Medications  doxycycline (VIBRA-TABS) tablet 100 mg (has no administration in time range)     Initial Impression / Assessment and Plan / ED Course  I have reviewed the triage vital signs and the nursing notes.  Pertinent labs & imaging results that were available during my care of the patient were reviewed by me and considered in my medical decision making (see chart for details).     Patient presenting today with a nasal abscess which has some minimal drainage.  Does not appear to be related to a dental source.  Patient is diabetic but is otherwise well-appearing.  Has no symptoms suggestive of sepsis.  At this time because there is some drainage and the location of the abscess and the pain that would be caused by doing an injection and I&D patient wanted to try antibiotics and warm compresses.  Stressed the importance of return in 2 days if symptoms are worsening.  Final Clinical Impressions(s) / ED Diagnoses   Final diagnoses:  Abscess of nose    ED Discharge Orders         Ordered    doxycycline (VIBRAMYCIN) 100 MG capsule  2 times daily     02/09/19 1220           [name], MD 02/09/19 1226

## 2019-02-09 NOTE — ED Triage Notes (Signed)
Redness, swelling, and pain to tip of nose x2 days.

## 2019-02-09 NOTE — Discharge Instructions (Signed)
Use warm compresses at least 3 times a day for .  Take the antibiotics as prescribed.  Symptoms are not getting better in 2 days or if things are rapidly worsening please return.

## 2019-02-09 NOTE — ED Notes (Signed)
C/o tip of nose red, swollen and painful x 2 days  Denies inj

## 2019-03-20 DIAGNOSIS — E0865 Diabetes mellitus due to underlying condition with hyperglycemia: Secondary | ICD-10-CM | POA: Insufficient documentation

## 2019-05-25 ENCOUNTER — Emergency Department (HOSPITAL_BASED_OUTPATIENT_CLINIC_OR_DEPARTMENT_OTHER)
Admission: EM | Admit: 2019-05-25 | Discharge: 2019-05-25 | Disposition: A | Discharge status: Home / Self Care | Payer: Medicaid Other | Attending: Emergency Medicine | Admitting: Emergency Medicine

## 2019-05-25 ENCOUNTER — Encounter (HOSPITAL_BASED_OUTPATIENT_CLINIC_OR_DEPARTMENT_OTHER): Payer: Self-pay

## 2019-05-25 ENCOUNTER — Other Ambulatory Visit: Payer: Self-pay

## 2019-05-25 DIAGNOSIS — E119 Type 2 diabetes mellitus without complications: Secondary | ICD-10-CM | POA: Insufficient documentation

## 2019-05-25 DIAGNOSIS — L0291 Cutaneous abscess, unspecified: Secondary | ICD-10-CM

## 2019-05-25 DIAGNOSIS — F17210 Nicotine dependence, cigarettes, uncomplicated: Secondary | ICD-10-CM | POA: Insufficient documentation

## 2019-05-25 DIAGNOSIS — Z7984 Long term (current) use of oral hypoglycemic drugs: Secondary | ICD-10-CM | POA: Diagnosis not present

## 2019-05-25 DIAGNOSIS — L02416 Cutaneous abscess of left lower limb: Secondary | ICD-10-CM | POA: Insufficient documentation

## 2019-05-25 MED ORDER — DOXYCYCLINE HYCLATE 100 MG PO CAPS: 100.0000 mg | ORAL_CAPSULE | Freq: Two times a day (BID) | ORAL | 0 refills | Status: DC

## 2019-05-25 MED ORDER — LIDOCAINE-EPINEPHRINE (PF) 2 %-1:200000 IJ SOLN
10.0000 mL | Freq: Once | INTRAMUSCULAR | Status: AC
  Administered 2019-05-25: 10 mL
  Filled 2019-05-25 (×2): qty 10

## 2019-05-25 MED ORDER — MUPIROCIN CALCIUM 2 % EX CREA: 1.0000 "application " | TOPICAL_CREAM | Freq: Two times a day (BID) | CUTANEOUS | 0 refills | Status: DC

## 2019-05-25 NOTE — ED Provider Notes (Signed)
[facility] EMERGENCY DEPARTMENT Provider Note   CSN: [id number] Arrival date & time: 05/25/19  1439    History   Chief Complaint Chief Complaint  Patient presents with  . Abscess    HPI Tony SaxonCurtis Hitsman Jr. is a 53 y.o. male.     Patient is a 53 year old gentleman with past medical history of back pain, diabetes who presents emergency department for abscess to the buttock.  Patient reports that 3 to 4 days ago he noticed an abscess to his posterior left buttock and thigh.  Reports that he has history of the same in the past.  Reports other small pustules throughout his groin and thighs.  [place] and had to pull some ticks off of him and thought he may have also been bitten by a spider.  Denies any fever, chills, nausea, vomiting.  Has not tried anything for relief.     Past Medical History:  Diagnosis Date  . Back pain   . Diabetes mellitus without complication (HCC)   . Sciatica     There are no active problems to display for this patient.   History reviewed. No pertinent surgical history.      Home Medications    Prior to Admission medications   Medication Sig Start Date End Date Taking? Authorizing Provider  cyclobenzaprine (FLEXERIL) 10 MG tablet Take 1 tablet (10 mg total) by mouth 2 (two) times daily as needed for muscle spasms. 09/05/17   Maxwell CaulLayden, Lindsey A, PA-C  doxycycline (VIBRAMYCIN) 100 MG capsule Take 1 capsule (100 mg total) by mouth 2 (two) times daily. 05/25/19   Arlyn DunningMcLean, Holdyn Poyser A, PA-C  HYDROcodone-acetaminophen (NORCO) 5-325 MG tablet Take 1-2 tablets by mouth every 4 (four) hours as needed for severe pain. 01/30/17   Loren RacerYelverton, David, MD  HYDROcodone-acetaminophen (NORCO) 7.5-325 MG tablet Take 1 tablet by mouth every 6 (six) hours as needed for moderate pain.    [provider]  ibuprofen (ADVIL,MOTRIN) 800 MG tablet Take 1 tablet (800 mg total) by mouth every 8 (eight)  hours as needed. 10/10/16   Lawyer, Cristal Deerhristopher, PA-C  METFORMIN HCL PO Take 1,000 mg by mouth 2 (two) times daily.     [provider]  mupirocin cream (BACTROBAN) 2 % Apply 1 application topically 2 (two) times daily. 05/25/19   Arlyn DunningMcLean, Merian Wroe A, PA-C  Saccharomyces boulardii (PROBIOTIC) 250 MG CAPS Take 1 capsule by mouth daily. 12/20/18   Law, Waylan BogaAlexandra M, PA-C  traMADol (ULTRAM) 50 MG tablet Take 1 tablet (50 mg total) by mouth every 6 (six) hours as needed for severe pain. 10/10/16   Charlestine NightLawyer, Christopher, PA-C    Family History No family history on file.  Social History Social History   Tobacco Use  . Smoking status: Current Every Day Smoker    Packs/day: 0.50  . Smokeless tobacco: Never Used  Substance Use Topics  . Alcohol use: Yes    Comment: occ  . Drug use: No     Allergies   Patient has no known allergies.   Review of Systems Review of Systems  Constitutional: Negative for chills and fever.  HENT: Negative for sore throat.   Eyes: Negative for pain and itching.  Respiratory: Negative for cough and shortness of breath.   Cardiovascular: Negative for chest pain and palpitations.  Gastrointestinal: Negative for abdominal pain, nausea and vomiting.  Genitourinary: Negative for dysuria and hematuria.  Musculoskeletal: Negative for arthralgias, back  pain, joint swelling and myalgias.  Skin: Positive for wound. Negative for color change and rash.  Allergic/Immunologic: Negative for immunocompromised state.  Neurological: Negative for weakness.  Hematological: Negative for adenopathy. Does not bruise/bleed easily.  All other systems reviewed and are negative.    Physical Exam Updated Vital Signs BP (!) 142/95 (BP Location: Right Arm)   Pulse 86   Temp 97.8 F (36.6 C) (Oral)   Resp 20   SpO2 98%   Physical Exam Vitals signs and nursing note reviewed.  Constitutional:      Appearance: Normal appearance.  HENT:     Head: Normocephalic.  Eyes:      Conjunctiva/sclera: Conjunctivae normal.  Pulmonary:     Effort: Pulmonary effort is normal.  Skin:    General: Skin is dry.          Comments: Patient has a 2-1/2 cm abscess to the posterior left thigh with about 4 cm of surrounding erythema.  It is fluctuant.  Patient also has a 2 mm pustule in the left groin as well as a 1 cm pustule in the right medial thigh no surrounding erythema to the subsequent pustules  Neurological:     Mental Status: He is alert.  Psychiatric:        Mood and Affect: Mood normal.      ED Treatments / Results  Labs (all labs ordered are listed, but only abnormal results are displayed) Labs Reviewed - No data to display  EKG None  Radiology No results found.  Procedures .Marland Kitchen.Incision and Drainage Date/Time: 05/25/2019 6:36 PM Performed by: Arlyn DunningMcLean, Jeramiah Mccaughey A, PA-C Authorized by: Arlyn DunningMcLean, Daquawn Seelman A, PA-C   Consent:    Consent obtained:  Verbal and written   Consent given by:  Patient   Risks discussed:  Bleeding, incomplete drainage, pain, infection and damage to other organs   Alternatives discussed:  Delayed treatment and alternative treatment Location:    Type:  Abscess   Size:  3cm   Location:  Lower extremity   Lower extremity location:  Leg   Leg location:  L upper leg Pre-procedure details:    Skin preparation:  Betadine Anesthesia (see MAR for exact dosages):    Anesthesia method:  Local infiltration   Local anesthetic:  Lidocaine 1% WITH epi Procedure details:    Incision types:  Single straight   Incision depth:  Subcutaneous   Scalpel blade:  11   Wound management:  Irrigated with saline   Drainage:  Bloody and purulent   Drainage amount:  Moderate   Wound treatment:  Wound left open   Packing materials:  None Post-procedure details:    Patient tolerance of procedure:  Tolerated well, no immediate complications   (including critical care time)  Medications Ordered in ED Medications  lidocaine-EPINEPHrine (XYLOCAINE W/EPI) 2  %-1:200000 (PF) injection 10 mL (10 mLs Infiltration Given 05/25/19 1753)     Initial Impression / Assessment and Plan / ED Course  I have reviewed the triage vital signs and the nursing notes.  Pertinent labs & imaging results that were available during my care of the patient were reviewed by me and considered in my medical decision making (see chart for details).        Based on review of vitals, medical screening exam, lab work and/or imaging, there does not appear to be an acute, emergent etiology for the patient's symptoms. Counseled pt on good return precautions and encouraged both PCP and ED follow-up as needed.  Prior to discharge,  I also discussed incidental imaging findings with patient in detail and advised appropriate, recommended follow-up in detail.  Clinical Impression: 1. Abscess     Disposition: Discharge  Prior to providing a prescription for a controlled substance, I independently reviewed the patient's recent prescription history on the [facility]. The patient had no recent or regular prescriptions and was deemed appropriate for a brief, less than 3 day prescription of narcotic for acute analgesia.  This note was prepared with assistance of Systems analyst. Occasional wrong-word or sound-a-like substitutions may have occurred due to the inherent limitations of voice recognition software.   Final Clinical Impressions(s) / ED Diagnoses   Final diagnoses:  Abscess    ED Discharge Orders         Ordered    doxycycline (VIBRAMYCIN) 100 MG capsule  2 times daily     05/25/19 1835    mupirocin cream (BACTROBAN) 2 %  2 times daily     05/25/19 1835           Alveria Apley, PA-C 05/25/19 Elenora Gamma, DO 05/25/19 1843

## 2019-05-25 NOTE — ED Triage Notes (Signed)
C/o abscess to left buttock, right thigh-pt states he has pulled ticks off himself over the last 2-3 days-NAD-steady gait

## 2019-05-25 NOTE — Discharge Instructions (Signed)
Thank you for allowing me to care for you today. Please return to the emergency department if you have new or worsening symptoms. Take your medications as instructed.  ° °

## 2019-07-11 ENCOUNTER — Emergency Department (HOSPITAL_BASED_OUTPATIENT_CLINIC_OR_DEPARTMENT_OTHER)
Admission: EM | Admit: 2019-07-11 | Discharge: 2019-07-11 | Disposition: A | Discharge status: Home / Self Care | Payer: Medicaid Other | Attending: Emergency Medicine | Admitting: Emergency Medicine

## 2019-07-11 ENCOUNTER — Other Ambulatory Visit: Payer: Self-pay

## 2019-07-11 DIAGNOSIS — L662 Folliculitis decalvans: Secondary | ICD-10-CM | POA: Diagnosis not present

## 2019-07-11 DIAGNOSIS — L089 Local infection of the skin and subcutaneous tissue, unspecified: Secondary | ICD-10-CM | POA: Diagnosis present

## 2019-07-11 DIAGNOSIS — E119 Type 2 diabetes mellitus without complications: Secondary | ICD-10-CM | POA: Diagnosis not present

## 2019-07-11 DIAGNOSIS — F17210 Nicotine dependence, cigarettes, uncomplicated: Secondary | ICD-10-CM | POA: Insufficient documentation

## 2019-07-11 DIAGNOSIS — Z7984 Long term (current) use of oral hypoglycemic drugs: Secondary | ICD-10-CM | POA: Diagnosis not present

## 2019-07-11 DIAGNOSIS — L739 Follicular disorder, unspecified: Secondary | ICD-10-CM

## 2019-07-11 MED ORDER — DOXYCYCLINE HYCLATE 100 MG PO CAPS: 100.0000 mg | ORAL_CAPSULE | Freq: Two times a day (BID) | ORAL | 0 refills | Status: DC

## 2019-07-11 NOTE — Discharge Instructions (Addendum)
Take the antibiotics as prescribed, continue with the warm soaks, return to an ED or your primary care doctor to be rechecked if the symptoms are not improving or you start having increasing swelling and tenderness.

## 2019-07-11 NOTE — ED Provider Notes (Signed)
[facility] EMERGENCY DEPARTMENT Provider Note   CSN: [id number] Arrival date & time: 07/11/19  1230    History   Chief Complaint Chief Complaint  Patient presents with  . Abscess    HPI Tony SaxonCurtis Hemric Jr. is a 53 y.o. male.     HPI Patient presents to the ED for evaluation of swelling and tenderness in the buttock region.  Patient states he has a history of getting recurrent skin infections especially in that area.  In the past he has been treated with antibiotics with good resolution but there have been times where he is required incision and drainage.  Patient has noticed a couple of areas of soreness in the buttock region.  He has been  soaking in Epson salt.  He denies any trouble with any fevers or chills.  No difficulty with dysuria. Past Medical History:  Diagnosis Date  . Back pain   . Diabetes mellitus without complication (HCC)   . Sciatica     There are no active problems to display for this patient.   No past surgical history on file.      Home Medications    Prior to Admission medications   Medication Sig Start Date End Date Taking? Authorizing Provider  HYDROcodone-acetaminophen (NORCO) 7.5-325 MG tablet Take 1 tablet by mouth every 6 (six) hours as needed for moderate pain.   Yes [provider]  METFORMIN HCL PO Take 1,000 mg by mouth 2 (two) times daily.    Yes [provider]  cyclobenzaprine (FLEXERIL) 10 MG tablet Take 1 tablet (10 mg total) by mouth 2 (two) times daily as needed for muscle spasms. 09/05/17   Maxwell CaulLayden, Lindsey A, PA-C  doxycycline (VIBRAMYCIN) 100 MG capsule Take 1 capsule (100 mg total) by mouth 2 (two) times daily. 07/11/19   Linwood DibblesKnapp, Bethel Gaglio, MD  HYDROcodone-acetaminophen (NORCO) 5-325 MG tablet Take 1-2 tablets by mouth every 4 (four) hours as needed for severe pain. 01/30/17   Loren RacerYelverton, David, MD  ibuprofen (ADVIL,MOTRIN) 800 MG tablet Take 1 tablet (800 mg total) by mouth every 8 (eight) hours as needed.  10/10/16   Lawyer, Cristal Deerhristopher, PA-C  mupirocin cream (BACTROBAN) 2 % Apply 1 application topically 2 (two) times daily. 05/25/19   Arlyn DunningMcLean, Kelly A, PA-C  Saccharomyces boulardii (PROBIOTIC) 250 MG CAPS Take 1 capsule by mouth daily. 12/20/18   Law, Waylan BogaAlexandra M, PA-C  traMADol (ULTRAM) 50 MG tablet Take 1 tablet (50 mg total) by mouth every 6 (six) hours as needed for severe pain. 10/10/16   Charlestine NightLawyer, Christopher, PA-C    Family History No family history on file.  Social History Social History   Tobacco Use  . Smoking status: Current Every Day Smoker    Packs/day: 0.50  . Smokeless tobacco: Never Used  Substance Use Topics  . Alcohol use: Yes    Comment: occ  . Drug use: No     Allergies   Patient has no known allergies.   Review of Systems Review of Systems  All other systems reviewed and are negative.    Physical Exam Updated Vital Signs BP (!) 138/98 (BP Location: Right Arm)   Pulse 91   Temp 97.7 F (36.5 C) (Oral)   Resp 18   Ht 1.778 m (5\' 10" )   Wt 88.5 kg   SpO2 100%   BMI 27.98 kg/m   Physical Exam Vitals signs and nursing note reviewed.  Constitutional:      General: He is not in acute distress.  Appearance: He is well-developed.  HENT:     Head: Normocephalic and atraumatic.     Right Ear: External ear normal.     Left Ear: External ear normal.  Eyes:     General: No scleral icterus.       Right eye: No discharge.        Left eye: No discharge.     Conjunctiva/sclera: Conjunctivae normal.  Neck:     Musculoskeletal: Neck supple.     Trachea: No tracheal deviation.  Cardiovascular:     Rate and Rhythm: Normal rate.  Pulmonary:     Effort: Pulmonary effort is normal. No respiratory distress.     Breath sounds: No stridor.  Abdominal:     General: There is no distension.  Musculoskeletal:        General: No swelling or deformity.     Comments: 2 small areas of superficial induration and mild tenderness, very mild erythema,  approximately 1  cm in size  Skin:    General: Skin is warm and dry.     Findings: No rash.  Neurological:     Mental Status: He is alert.     Cranial Nerves: Cranial nerve deficit: no gross deficits.      ED Treatments / Results   No results found.  Procedures Procedures (including critical care time)  Medications Ordered in ED Medications - No data to display   Initial Impression / Assessment and Plan / ED Course  I have reviewed the triage vital signs and the nursing notes.  Pertinent labs & imaging results that were available during my care of the patient were reviewed by me and considered in my medical decision making (see chart for details).   Patient has 2 small discrete areas of erythema.  There is mild induration with a small area of irritation to the skin consistent with folliculitis.  No fluctuance.  No appreciable abscess.  Plan on discharge home with a course of doxycycline.  Final Clinical Impressions(s) / ED Diagnoses   Final diagnoses:  Folliculitis    ED Discharge Orders         Ordered    doxycycline (VIBRAMYCIN) 100 MG capsule  2 times daily     07/11/19 1455           Dorie Rank, MD 07/11/19 [phone]

## 2019-07-11 NOTE — ED Triage Notes (Signed)
Pt here with abscess on tailbone x 3days.

## 2019-08-03 ENCOUNTER — Other Ambulatory Visit: Payer: Self-pay

## 2019-08-03 ENCOUNTER — Emergency Department (HOSPITAL_BASED_OUTPATIENT_CLINIC_OR_DEPARTMENT_OTHER)
Admission: EM | Admit: 2019-08-03 | Discharge: 2019-08-03 | Disposition: A | Discharge status: Home / Self Care | Payer: Medicaid Other | Attending: Emergency Medicine | Admitting: Emergency Medicine

## 2019-08-03 ENCOUNTER — Encounter (HOSPITAL_BASED_OUTPATIENT_CLINIC_OR_DEPARTMENT_OTHER): Payer: Self-pay | Admitting: *Deleted

## 2019-08-03 DIAGNOSIS — L03317 Cellulitis of buttock: Secondary | ICD-10-CM

## 2019-08-03 DIAGNOSIS — L03116 Cellulitis of left lower limb: Secondary | ICD-10-CM | POA: Diagnosis not present

## 2019-08-03 DIAGNOSIS — H60331 Swimmer's ear, right ear: Secondary | ICD-10-CM | POA: Insufficient documentation

## 2019-08-03 DIAGNOSIS — F17210 Nicotine dependence, cigarettes, uncomplicated: Secondary | ICD-10-CM | POA: Insufficient documentation

## 2019-08-03 DIAGNOSIS — H9201 Otalgia, right ear: Secondary | ICD-10-CM | POA: Diagnosis present

## 2019-08-03 DIAGNOSIS — Z79899 Other long term (current) drug therapy: Secondary | ICD-10-CM | POA: Diagnosis not present

## 2019-08-03 DIAGNOSIS — Z7984 Long term (current) use of oral hypoglycemic drugs: Secondary | ICD-10-CM | POA: Diagnosis not present

## 2019-08-03 DIAGNOSIS — E119 Type 2 diabetes mellitus without complications: Secondary | ICD-10-CM | POA: Insufficient documentation

## 2019-08-03 MED ORDER — SULFAMETHOXAZOLE-TRIMETHOPRIM 800-160 MG PO TABS
1.0000 | ORAL_TABLET | Freq: Once | ORAL | Status: AC
  Administered 2019-08-03: 1 via ORAL
  Filled 2019-08-03: qty 1

## 2019-08-03 MED ORDER — SULFAMETHOXAZOLE-TRIMETHOPRIM 800-160 MG PO TABS: 1.0000 | ORAL_TABLET | Freq: Two times a day (BID) | ORAL | 0 refills | Status: AC

## 2019-08-03 MED ORDER — NEOMYCIN-POLYMYXIN-HC 3.5-10000-1 OT SUSP: 4.0000 [drp] | Freq: Four times a day (QID) | OTIC | 0 refills | Status: AC

## 2019-08-03 NOTE — ED Triage Notes (Signed)
Pt c/oinsect bite to right buttocks left leg and also c/o right ear pain x 2 weeks

## 2019-08-03 NOTE — Discharge Instructions (Addendum)
° °  Wound Care Please take all of your antibiotics until finished!   You may develop abdominal discomfort or diarrhea from the antibiotic.  You may help offset this with probiotics which you can buy or get in yogurt. Do not eat or take the probiotics until 2 hours after your antibiotic.  Cleaning: Clean the wound and surrounding area gently with tap water and mild soap. Rinse well and blot dry. You may shower normally. Soaking the wound in Epsom salt baths for no more than 15 minutes once a day may help rinse out any remaining pus and help with wound healing.  Clean the wound daily to prevent further infection. Do not use cleaners such as hydrogen peroxide or alcohol.   Scar reduction: Application of a topical antibiotic ointment, such as Neosporin, after the wound has begun to close and heal well can decrease scab formation and reduce scarring. After the wound has healed, application of ointments such as Aquaphor can also reduce scar formation.  The key to scar reduction is keeping the skin well hydrated and supple. Drinking plenty of water throughout the day (At least eight 8oz glasses of water a day) is essential to staying well hydrated.  Sun exposure: Keep the wound out of the sun. After the wound has healed, continue to protect it from the sun by wearing protective clothing or applying sunscreen.  Pain: You may use Tylenol, naproxen, or ibuprofen for pain.  Prevention: There is some people that have a predisposition to abscess formation, however, there are some things that can be done to prevent abscesses in many people.  Most abscesses form because bacteria that naturally lives on the skin gets trapped underneath the skin.  This can occur through openings too small to see. Before and after any area of skin is shaved, wax, or abraded in any manner, the area should be washed with soap and water and rinsed well.   If you are having trouble with recurrent abscesses, it may be wise to perform a  chlorhexidine wash regimen.  For 1 week, wash all of your body with chlorhexidine (available over-the-counter at most pharmacies). You may also need to reevaluate your use of daily soap as soaps with perfumes or dyes can increase the chances of infection in some people.  Follow up: Please return to the ED or go to your primary care provider in 2-3 days for a wound check to assure proper healing.  Return: Return to the ED sooner should signs of worsening infection arise, such as spreading redness, worsening puffiness/swelling, severe increase in pain, fever over 100.58F, or any other major issues.  For prescription assistance, may try using prescription discount sites or apps, such as goodrx.com  Ear pain  It appears as though you have an ear infection called otitis externa. Use the ear drops 4 drops four times a day for 7 days.

## 2019-08-03 NOTE — ED Notes (Signed)
Small amount earwax removed right ear. Pt reports relief.

## 2019-08-03 NOTE — ED Provider Notes (Signed)
[facility] EMERGENCY DEPARTMENT Provider Note   CSN: [id number] Arrival date & time: 08/03/19  1544     History   Chief Complaint Chief Complaint  Patient presents with  . Insect Bite    HPI Tony SaxonCurtis Kewley Jr. is a 53 y.o. male.     HPI   Tony SaxonCurtis Arias Jr. is a 53 y.o. male, with a history of DM, presenting to the ED with 2 complaints. Patient complains of right ear pain for the last several days.  He notes he went swimming about a week ago.  His pain is throbbing and aching, moderate, nonradiating. Denies fever, ear drainage, dizziness, headache, jaw pain, nasal congestion, cough, facial swelling.  Patient would also like examination of two painful areas, one on the left shin and one on the right buttock.  These have both been present for over a week.  He thinks they may be spider bites. He denies drainage from the regions.      Past Medical History:  Diagnosis Date  . Back pain   . Diabetes mellitus without complication (HCC)   . Sciatica     There are no active problems to display for this patient.   Past Surgical History:  Procedure Laterality Date  . TONSILLECTOMY          Home Medications    Prior to Admission medications   Medication Sig Start Date End Date Taking? Authorizing Provider  cyclobenzaprine (FLEXERIL) 10 MG tablet Take 1 tablet (10 mg total) by mouth 2 (two) times daily as needed for muscle spasms. 09/05/17   Maxwell CaulLayden, Lindsey A, PA-C  HYDROcodone-acetaminophen (NORCO) 5-325 MG tablet Take 1-2 tablets by mouth every 4 (four) hours as needed for severe pain. 01/30/17   Loren RacerYelverton, David, MD  HYDROcodone-acetaminophen (NORCO) 7.5-325 MG tablet Take 1 tablet by mouth every 6 (six) hours as needed for moderate pain.    [provider]  ibuprofen (ADVIL,MOTRIN) 800 MG tablet Take 1 tablet (800 mg total) by mouth every 8 (eight) hours as needed. 10/10/16   Lawyer, Cristal Deerhristopher, PA-C  METFORMIN HCL PO Take 1,000 mg by mouth 2 (two)  times daily.     [provider]  mupirocin cream (BACTROBAN) 2 % Apply 1 application topically 2 (two) times daily. 05/25/19   Arlyn DunningMcLean, Kelly A, PA-C  neomycin-polymyxin-hydrocortisone (CORTISPORIN) 3.5-10000-1 OTIC suspension Place 4 drops into the right ear 4 (four) times daily for 7 days. 08/03/19 08/10/19  Anddy Wingert, Hillard DankerShawn C, PA-C  Saccharomyces boulardii (PROBIOTIC) 250 MG CAPS Take 1 capsule by mouth daily. 12/20/18   Law, Waylan BogaAlexandra M, PA-C  sulfamethoxazole-trimethoprim (BACTRIM DS) 800-160 MG tablet Take 1 tablet by mouth 2 (two) times daily for 7 days. 08/03/19 08/10/19  Shaquel Chavous C, PA-C  traMADol (ULTRAM) 50 MG tablet Take 1 tablet (50 mg total) by mouth every 6 (six) hours as needed for severe pain. 10/10/16   Charlestine NightLawyer, Christopher, PA-C    Family History History reviewed. No pertinent family history.  Social History Social History   Tobacco Use  . Smoking status: Current Every Day Smoker    Packs/day: 0.50  . Smokeless tobacco: Never Used  Substance Use Topics  . Alcohol use: Yes    Comment: occ  . Drug use: No     Allergies   Patient has no known allergies.   Review of Systems Review of Systems  Constitutional: Negative for chills and fever.  HENT: Positive for ear pain. Negative for congestion, facial swelling, sore throat and trouble swallowing.  Respiratory: Negative for cough and shortness of breath.   Cardiovascular: Negative for chest pain.  Skin: Positive for color change and wound.  Neurological: Negative for dizziness, light-headedness, numbness and headaches.     Physical Exam Updated Vital Signs BP 129/86 (BP Location: Right Arm)   Pulse 77   Temp 98.6 F (37 C) (Oral)   Resp 18   Ht 5\' 10"  (1.778 m)   Wt 88 kg   SpO2 99%   BMI 27.84 kg/m   Physical Exam Vitals signs and nursing note reviewed.  Constitutional:      General: He is not in acute distress.    Appearance: He is well-developed. He is not diaphoretic.  HENT:     Head:  Normocephalic and atraumatic.     Right Ear: Tympanic membrane normal.     Left Ear: Tympanic membrane, ear canal and external ear normal.     Ears:     Comments: Erythema and white debris in the right ear canal.  Tender tragus.  No mastoid tenderness or swelling.  No auricular swelling or erythema.    Nose: Nose normal.  Eyes:     Conjunctiva/sclera: Conjunctivae normal.  Neck:     Musculoskeletal: Neck supple.  Cardiovascular:     Rate and Rhythm: Normal rate and regular rhythm.  Pulmonary:     Effort: Pulmonary effort is normal.  Skin:    General: Skin is warm and dry.     Coloration: Skin is not pale.     Comments: Approximately 1.5 cm area of erythema, tenderness, and swelling to the left anterior lower leg as well as a similar finding on the right buttock.  No noted fluctuance.  Neurological:     Mental Status: He is alert.  Psychiatric:        Behavior: Behavior normal.      ED Treatments / Results  Labs (all labs ordered are listed, but only abnormal results are displayed) Labs Reviewed - No data to display  EKG None  Radiology No results found.  Procedures Ultrasound ED Soft Tissue  Date/Time: 08/03/2019 6:00 PM Performed by: Lorayne Bender, PA-C Authorized by: Lorayne Bender, PA-C   Procedure details:    Indications: localization of abscess and evaluate for cellulitis     Transverse view:  Visualized   Longitudinal view:  Visualized   Images: archived   Location:    Location: buttocks     Side:  Right Findings:     no abscess present    cellulitis present Ultrasound ED Soft Tissue  Date/Time: 08/03/2019 5:55 PM Performed by: Lorayne Bender, PA-C Authorized by: Lorayne Bender, PA-C   Procedure details:    Indications: localization of abscess and evaluate for cellulitis     Transverse view:  Visualized   Longitudinal view:  Visualized   Images: archived   Location:    Location: lower extremity     Side:  Left Findings:     no abscess present     cellulitis present   (including critical care time)  Medications Ordered in ED Medications  sulfamethoxazole-trimethoprim (BACTRIM DS) 800-160 MG per tablet 1 tablet (1 tablet Oral Given 08/03/19 1823)     Initial Impression / Assessment and Plan / ED Course  I have reviewed the triage vital signs and the nursing notes.  Pertinent labs & imaging results that were available during my care of the patient were reviewed by me and considered in my medical decision making (see chart for  details).        Patient presents with complaint of right ear pain.  Physical exam findings along with history elements suggest otitis externa. There are no findings to suggest more serious diagnoses, such as malignant otitis externa or mastoiditis.  The painful skin lesions do not appear to have fluid collections on bedside ultrasound.  We will treat the cellulitis.  The patient was given instructions for home care as well as return precautions. Patient voices understanding of these instructions, accepts the plan, and is comfortable with discharge.   Final Clinical Impressions(s) / ED Diagnoses   Final diagnoses:  Acute swimmer's ear of right side  Cellulitis of left lower extremity  Cellulitis of buttock    ED Discharge Orders         Ordered    neomycin-polymyxin-hydrocortisone (CORTISPORIN) 3.5-10000-1 OTIC suspension  4 times daily     08/03/19 1812    sulfamethoxazole-trimethoprim (BACTRIM DS) 800-160 MG tablet  2 times daily     08/03/19 1812           Anselm PancoastJoy, Kahley Leib C, PA-C 08/07/19 1146    Tegeler, Canary Brimhristopher J, MD 08/08/19 [phone]

## 2019-08-03 NOTE — ED Notes (Signed)
C/o ? Insect bite to left lower leg some redness and scab,  Bump on rt hip and rt ear pain

## 2019-08-19 ENCOUNTER — Emergency Department (HOSPITAL_BASED_OUTPATIENT_CLINIC_OR_DEPARTMENT_OTHER)
Admission: EM | Admit: 2019-08-19 | Discharge: 2019-08-19 | Disposition: A | Discharge status: Home / Self Care | Payer: Medicaid Other | Attending: Emergency Medicine | Admitting: Emergency Medicine

## 2019-08-19 ENCOUNTER — Other Ambulatory Visit: Payer: Self-pay

## 2019-08-19 ENCOUNTER — Encounter (HOSPITAL_BASED_OUTPATIENT_CLINIC_OR_DEPARTMENT_OTHER): Payer: Self-pay

## 2019-08-19 DIAGNOSIS — L0231 Cutaneous abscess of buttock: Secondary | ICD-10-CM | POA: Insufficient documentation

## 2019-08-19 DIAGNOSIS — E119 Type 2 diabetes mellitus without complications: Secondary | ICD-10-CM | POA: Diagnosis not present

## 2019-08-19 DIAGNOSIS — F17210 Nicotine dependence, cigarettes, uncomplicated: Secondary | ICD-10-CM | POA: Insufficient documentation

## 2019-08-19 DIAGNOSIS — Z7984 Long term (current) use of oral hypoglycemic drugs: Secondary | ICD-10-CM | POA: Insufficient documentation

## 2019-08-19 DIAGNOSIS — R222 Localized swelling, mass and lump, trunk: Secondary | ICD-10-CM | POA: Diagnosis present

## 2019-08-19 MED ORDER — LIDOCAINE-EPINEPHRINE (PF) 2 %-1:200000 IJ SOLN
INTRAMUSCULAR | Status: AC
  Filled 2019-08-19: qty 20

## 2019-08-19 MED ORDER — DOXYCYCLINE HYCLATE 100 MG PO CAPS: 100.0000 mg | ORAL_CAPSULE | Freq: Two times a day (BID) | ORAL | 0 refills | Status: DC

## 2019-08-19 MED ORDER — DOXYCYCLINE HYCLATE 100 MG PO TABS
100.0000 mg | ORAL_TABLET | Freq: Once | ORAL | Status: AC
  Administered 2019-08-19: 01:00:00 100 mg via ORAL
  Filled 2019-08-19: qty 1

## 2019-08-19 NOTE — ED Triage Notes (Signed)
Pt reports abscess on right distal buttocks, has reoccurring abscess occurring mostly on butt but can also appear down legs. Painful to touch, denies fever. Notable redness & swelling.

## 2019-08-19 NOTE — ED Provider Notes (Signed)
[facility] Provider Note: Lowella DellJ. Lane Stetson Pelaez, MD, FACEP  CSN: [id number] MRN: [id number] ARRIVAL: 08/19/19 at 0041 ROOM: [facility]   CHIEF COMPLAINT  Abscess   HISTORY OF PRESENT ILLNESS  08/19/19 12:59 AM Tony Saxonurtis Amsden Jr. is a 53 y.o. male with a history of multiple visits for abscesses.  He is here with a tender, swollen area to his right buttock with central eschar.  It is been present for about 4 days.  There has been no drainage.  He rates the pain as a 7 out of 10, worse with movement or sitting.  He denies systemic symptoms such as fever or chills.   Past Medical History:  Diagnosis Date  . Back pain   . Diabetes mellitus without complication (HCC)   . Sciatica     Past Surgical History:  Procedure Laterality Date  . TONSILLECTOMY      History reviewed. No pertinent family history.  Social History   Tobacco Use  . Smoking status: Current Every Day Smoker    Packs/day: 0.50  . Smokeless tobacco: Never Used  Substance Use Topics  . Alcohol use: Yes    Comment: occ  . Drug use: No    Prior to Admission medications   Medication Sig Start Date End Date Taking? Authorizing Provider  doxycycline (VIBRAMYCIN) 100 MG capsule Take 1 capsule (100 mg total) by mouth 2 (two) times daily. 08/19/19   Muhanad Torosyan, MD  METFORMIN HCL PO Take 1,000 mg by mouth 2 (two) times daily.     [provider]  Saccharomyces boulardii (PROBIOTIC) 250 MG CAPS Take 1 capsule by mouth daily. 12/20/18   Emi HolesLaw, Alexandra M, PA-C    Allergies Patient has no known allergies.   REVIEW OF SYSTEMS  Negative except as noted here or in the History of Present Illness.   PHYSICAL EXAMINATION  Initial Vital Signs Blood pressure 129/80, pulse 75, temperature 97.7 F (36.5 C), temperature source Oral, resp. rate 16, height 5\' 10"  (1.778 m), weight 88.5 kg, SpO2 97 %.  Examination General: Well-developed, well-nourished male in no acute distress; appearance consistent with age of  record HENT: normocephalic; atraumatic Eyes: Normal appearance Neck: supple Heart: regular rate and rhythm Lungs: clear to auscultation bilaterally Abdomen: soft; nondistended; nontender; bowel sounds present Extremities: No deformity; full range of motion; pulses normal Neurologic: Awake, alert and oriented; motor function intact in all extremities and symmetric; no facial droop Skin: Warm and dry; tender, swollen area right buttock with central necrosis:    Psychiatric: Normal mood and affect   RESULTS  Summary of this visit's results, reviewed by myself:   EKG Interpretation  Date/Time:    Ventricular Rate:    PR Interval:    QRS Duration:   QT Interval:    QTC Calculation:   R Axis:     Text Interpretation:        Laboratory Studies: No results found for this or any previous visit (from the past 24 hour(s)). Imaging Studies: No results found.  ED COURSE and MDM  Nursing notes and initial vitals signs, including pulse oximetry, reviewed.  Vitals:   08/19/19 0050 08/19/19 0051  BP: 129/80   Pulse: 75   Resp: 16   Temp: 97.7 F (36.5 C)   TempSrc: Oral   SpO2: 97%   Weight:  88.5 kg  Height:  5\' 10"  (1.778 m)   No pus pocket seen on bedside ultrasound.  We will defer I&D and treat with doxycycline.  He was  advised to return if symptoms worsen and we will consider I&D at that time.  PROCEDURES    ED DIAGNOSES     ICD-10-CM   1. Abscess of buttock, right  L02.31        Oda Placke, Jenny Reichmann, MD 08/19/19 [phone]

## 2019-10-29 ENCOUNTER — Emergency Department (HOSPITAL_BASED_OUTPATIENT_CLINIC_OR_DEPARTMENT_OTHER)
Admission: EM | Admit: 2019-10-29 | Discharge: 2019-10-29 | Disposition: A | Discharge status: Home / Self Care | Payer: Medicaid Other | Attending: Emergency Medicine | Admitting: Emergency Medicine

## 2019-10-29 ENCOUNTER — Encounter (HOSPITAL_BASED_OUTPATIENT_CLINIC_OR_DEPARTMENT_OTHER): Payer: Self-pay

## 2019-10-29 ENCOUNTER — Other Ambulatory Visit: Payer: Self-pay

## 2019-10-29 DIAGNOSIS — F17210 Nicotine dependence, cigarettes, uncomplicated: Secondary | ICD-10-CM | POA: Insufficient documentation

## 2019-10-29 DIAGNOSIS — E119 Type 2 diabetes mellitus without complications: Secondary | ICD-10-CM | POA: Diagnosis not present

## 2019-10-29 DIAGNOSIS — L02416 Cutaneous abscess of left lower limb: Secondary | ICD-10-CM | POA: Insufficient documentation

## 2019-10-29 DIAGNOSIS — Z7984 Long term (current) use of oral hypoglycemic drugs: Secondary | ICD-10-CM | POA: Insufficient documentation

## 2019-10-29 DIAGNOSIS — L0291 Cutaneous abscess, unspecified: Secondary | ICD-10-CM

## 2019-10-29 MED ORDER — LIDOCAINE-EPINEPHRINE (PF) 2 %-1:200000 IJ SOLN
10.0000 mL | Freq: Once | INTRAMUSCULAR | Status: AC
  Administered 2019-10-29: 10 mL
  Filled 2019-10-29: qty 10

## 2019-10-29 MED ORDER — PENTAFLUOROPROP-TETRAFLUOROETH EX AERO
INHALATION_SPRAY | CUTANEOUS | Status: DC | PRN
  Administered 2019-10-29: 30 via TOPICAL

## 2019-10-29 MED ORDER — DOXYCYCLINE HYCLATE 100 MG PO CAPS: 100.0000 mg | ORAL_CAPSULE | Freq: Two times a day (BID) | ORAL | 0 refills | Status: DC

## 2019-10-29 MED ORDER — PENTAFLUOROPROP-TETRAFLUOROETH EX AERO
INHALATION_SPRAY | CUTANEOUS | Status: AC
  Filled 2019-10-29: qty 30

## 2019-10-29 MED FILL — DOXYCYCLINE HYCLATE 100 MG: 100 | 10 days supply | Qty: 20 | Fill #0

## 2019-10-29 NOTE — Discharge Instructions (Addendum)
You were seen in the emergency department for an abscess on your right lower leg.  You were treated with incision and drainage and prescribed an antibiotic.  If symptoms should worsen, you develop fevers and chills, you can return to the emergency department.  Please consider stopping smoking and reaching out to your primary care doctor for medical assistance with this as needed.

## 2019-10-29 NOTE — ED Notes (Signed)
ED Provider at bedside. 

## 2019-10-29 NOTE — ED Triage Notes (Signed)
Pt reports insect bite to right calf, states that he has not seen a bug actually bite him but also has "bites" to his right and left buttocks. Pt reports the area to his RLE started on Saturday, attempted cleaning with hydrogen peroxide at home.

## 2019-10-29 NOTE — ED Provider Notes (Signed)
[facility] EMERGENCY DEPARTMENT Provider Note   CSN: [id number] Arrival date & time: 10/29/19  1028     History   Chief Complaint Chief Complaint  Patient presents with  . Abscess    HPI Tony Patel. is a 53 y.o. male.     HPI [name] is a 53 year old male with a past medical history of diabetes, tobacco use and multiple prior skin abscesses treated with I&D and antibiotics.  He presents with a couple week history of a left lower extremity abscess that is painful.  He reports soaking it in Epson salts.  He reports picking off the scab and allowing it to bleed.  He denies systemic symptoms such as fevers and chills.  ROS negative for rhinorrhea, cough, sore throat, chest pain, shortness of breath, abdominal pain, blurry vision, falls, headaches, easy bruising.  Past Medical History:  Diagnosis Date  . Back pain   . Diabetes mellitus without complication ([place])   . Sciatica     There are no active problems to display for this patient.   Past Surgical History:  Procedure Laterality Date  . TONSILLECTOMY          Home Medications    Prior to Admission medications   Medication Sig Start Date End Date Taking? Authorizing Provider  doxycycline (VIBRAMYCIN) 100 MG capsule Take 1 capsule (100 mg total) by mouth 2 (two) times daily. 10/29/19   Al Decant, MD  METFORMIN HCL PO Take 1,000 mg by mouth 2 (two) times daily.     [provider]  Saccharomyces boulardii (PROBIOTIC) 250 MG CAPS Take 1 capsule by mouth daily. 12/20/18   Frederica Kuster, PA-C    Family History History reviewed. No pertinent family history.  Social History Social History   Tobacco Use  . Smoking status: Current Every Day Smoker    Packs/day: 0.50  . Smokeless tobacco: Never Used  Substance Use Topics  . Alcohol use: Yes    Comment: occ  . Drug use: No     Allergies   Patient has no known allergies.   Review of Systems Review of Systems  Constitutional:  Negative for chills and fever.  HENT: Negative for sore throat.   Eyes: Negative for visual disturbance.  Respiratory: Negative for cough and shortness of breath.   Cardiovascular: Negative for chest pain.  Gastrointestinal: Negative for abdominal pain.  Genitourinary: Negative for dysuria.  Musculoskeletal:       Pain in area around abscesses  Skin: Positive for wound (RLE abscess).  Neurological: Negative for headaches.  Hematological: Does not bruise/bleed easily.  Psychiatric/Behavioral: The patient is not nervous/anxious.    Physical Exam Updated Vital Signs BP (!) 141/92 (BP Location: Right Arm)   Pulse 92   Temp 98 F (36.7 C) (Oral)   Resp 16   Ht 5\' 10"  (1.778 m)   Wt 88.5 kg   SpO2 98%   BMI 27.98 kg/m   Physical Exam Vitals signs and nursing note reviewed.  Constitutional:      Appearance: Normal appearance.  Neck:     Musculoskeletal: Normal range of motion and neck supple.  Cardiovascular:     Rate and Rhythm: Normal rate and regular rhythm.     Pulses: Normal pulses.     Heart sounds: Normal heart sounds. No murmur.  Pulmonary:     Effort: Pulmonary effort is normal. No respiratory distress.     Breath sounds: Normal breath sounds.  Abdominal:     General:  Abdomen is flat. There is no distension.  Genitourinary:    Comments: Multiple flat hyperpigmented scars noted on buttocks consistent with healed prior abscesses Musculoskeletal: Normal range of motion.  Skin:    General: Skin is warm and dry.     Findings: Lesion (RLE abscess, 2x2 cm of erythema with central eschar with 1x1 cm of fluctuance) present.  Neurological:     General: No focal deficit present.     Mental Status: He is alert and oriented to person, place, and time.  Psychiatric:        Mood and Affect: Mood normal.    ED Treatments / Results  Labs (all labs ordered are listed, but only abnormal results are displayed) Labs Reviewed - No data to display  EKG None  Radiology No  results found.  Procedures .Marland KitchenIncision and Drainage  Date/Time: 10/29/2019 11:49 AM Performed by: Jenell Milliner, MD Authorized by: Jenell Milliner, MD   Consent:    Consent obtained:  Verbal   Consent given by:  Patient   Risks discussed:  Bleeding, incomplete drainage and pain   Alternatives discussed:  No treatment and observation Location:    Type:  Abscess   Location:  Lower extremity   Lower extremity location:  Leg   Leg location:  R lower leg Pre-procedure details:    Skin preparation:  Betadine Anesthesia (see MAR for exact dosages):    Anesthesia method:  Topical application and local infiltration   Topical anesthesia: pentafluoroprop-tetrafluoroeth (GEBAUERS) aerosol.   Local anesthetic:  Lidocaine 1% WITH epi Procedure type:    Complexity:  Simple Procedure details:    Needle aspiration: no     Incision types:  Single straight   Incision depth:  Dermal   Scalpel blade:  10   Drainage:  Purulent and bloody   Drainage amount:  Moderate   Wound treatment:  Wound left open   Packing materials:  None Post-procedure details:    Patient tolerance of procedure:  Tolerated well, no immediate complications   (including critical care time)  Medications Ordered in ED Medications  pentafluoroprop-tetrafluoroeth (GEBAUERS) aerosol (30 application Topical Given 10/29/19 1125)  lidocaine-EPINEPHrine (XYLOCAINE W/EPI) 2 %-1:200000 (PF) injection 10 mL (10 mLs Infiltration Given 10/29/19 1125)     Initial Impression / Assessment and Plan / ED Course  I have reviewed the triage vital signs and the nursing notes.  Pertinent labs & imaging results that were available during my care of the patient were reviewed by me and considered in my medical decision making (see chart for details).        [name] is a 53 year old male with a past medical history significant for type 2 diabetes, tobacco use and multiple prior abscesses treated with incision and drainage and  antibiotics who presents with a new right lower extremity abscess with stable vitals, no systemic symptoms such as fever or chills concerning for systemic infection, who is being treated with I&D and oral antibiotics outpatient.  It was recommended that he stop smoking and follow-up with his PCP for diabetes management.  Final Clinical Impressions(s) / ED Diagnoses   Final diagnoses:  Abscess    ED Discharge Orders         Ordered    doxycycline (VIBRAMYCIN) 100 MG capsule  2 times daily     10/29/19 1147           Jenell Milliner, MD 10/29/19 1158    [name], MD 10/29/19 1302

## 2019-11-18 ENCOUNTER — Emergency Department (HOSPITAL_BASED_OUTPATIENT_CLINIC_OR_DEPARTMENT_OTHER)
Admission: EM | Admit: 2019-11-18 | Discharge: 2019-11-18 | Disposition: A | Discharge status: Home / Self Care | Payer: Medicaid Other | Attending: Emergency Medicine | Admitting: Emergency Medicine

## 2019-11-18 ENCOUNTER — Encounter (HOSPITAL_BASED_OUTPATIENT_CLINIC_OR_DEPARTMENT_OTHER): Payer: Self-pay | Admitting: *Deleted

## 2019-11-18 ENCOUNTER — Other Ambulatory Visit: Payer: Self-pay

## 2019-11-18 ENCOUNTER — Emergency Department (HOSPITAL_BASED_OUTPATIENT_CLINIC_OR_DEPARTMENT_OTHER): Payer: Medicaid Other

## 2019-11-18 DIAGNOSIS — F17210 Nicotine dependence, cigarettes, uncomplicated: Secondary | ICD-10-CM | POA: Insufficient documentation

## 2019-11-18 DIAGNOSIS — Z7984 Long term (current) use of oral hypoglycemic drugs: Secondary | ICD-10-CM | POA: Diagnosis not present

## 2019-11-18 DIAGNOSIS — E119 Type 2 diabetes mellitus without complications: Secondary | ICD-10-CM | POA: Diagnosis not present

## 2019-11-18 DIAGNOSIS — M25512 Pain in left shoulder: Secondary | ICD-10-CM | POA: Diagnosis not present

## 2019-11-18 MED ORDER — IBUPROFEN 800 MG PO TABS
800.0000 mg | ORAL_TABLET | Freq: Once | ORAL | Status: AC
  Administered 2019-11-18: 09:00:00 800 mg via ORAL
  Filled 2019-11-18: qty 1

## 2019-11-18 MED ORDER — LIDOCAINE 5 % EX PTCH
1.0000 | MEDICATED_PATCH | Freq: Once | CUTANEOUS | Status: DC
  Filled 2019-11-18: qty 1

## 2019-11-18 MED ORDER — MELOXICAM 7.5 MG PO TABS: 15.0000 mg | ORAL_TABLET | Freq: Every day | ORAL | 0 refills | Status: AC

## 2019-11-18 MED FILL — MELOXICAM 7.5 MG TABLET: 7.5 | 30 days supply | Qty: 60 | Fill #0

## 2019-11-18 NOTE — ED Provider Notes (Signed)
[facility] EMERGENCY DEPARTMENT Provider Note   CSN: [id number] Arrival date & time: 11/18/19  0848     History   Chief Complaint Chief Complaint  Patient presents with  . Shoulder pain    HPI Tony Patel. is a 53 y.o. male with a past medical history significant for diabetes and low back pain with sciatica who presents to the ED for an evaluation of gradual onset of left shoulder pain for the past few months.  Patient notes his left shoulder pain has worsened over the past few days.  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  Patient is prescribed 10 mg of hydrocodone for his chronic low back pain which he notes has been helping with his shoulder pain.  Shoulder pain is worse with movement especially any overhead movement.  Most of the pain is located on the anterior shoulder. Patient denies numbness/ tingling, weakness, and edema .   Past Medical History:  Diagnosis Date  . Back pain   . Diabetes mellitus without complication ([place])   . Sciatica     There are no active problems to display for this patient.   Past Surgical History:  Procedure Laterality Date  . TONSILLECTOMY          Home Medications    Prior to Admission medications   Medication Sig Start Date End Date Taking? Authorizing Provider  HYDROcodone-acetaminophen (NORCO) 10-325 MG tablet Take 1 tablet by mouth every 4 (four) hours as needed.   Yes [provider]  METFORMIN HCL PO Take 1,000 mg by mouth 2 (two) times daily.    Yes [provider]  meloxicam (MOBIC) 7.5 MG tablet Take 2 tablets (15 mg total) by mouth daily. 11/18/19 12/18/19  Jonette Eva, PA-C    Family History History reviewed. No pertinent family history.  Social History Social History   Tobacco Use  . Smoking status: Current Every Day Smoker    Packs/day: 0.50  . Smokeless tobacco: Never Used  Substance Use Topics  . Alcohol use: Yes    Comment: occ  . Drug use: No     Allergies   Patient has no known allergies.   Review of Systems Review of Systems  Constitutional: Negative for chills and fever.  Respiratory: Negative for shortness of breath.   Cardiovascular: Negative for chest pain.  Musculoskeletal: Positive for arthralgias and back pain (chronic). Negative for joint swelling.  Skin: Negative for color change.     Physical Exam Updated Vital Signs BP (!) 141/107 (BP Location: Right Arm)   Pulse 91   Temp 98.4 F (36.9 C) (Oral)   Resp 16   Ht 5\' 10"  (1.778 m)   Wt 88.5 kg   SpO2 97%   BMI 27.98 kg/m   Physical Exam Vitals signs and nursing note reviewed.  Constitutional:      General: He is not in acute distress.    Appearance: He is not ill-appearing.  HENT:     Head: Normocephalic.  Eyes:     Pupils: Pupils are equal, round, and reactive to light.  Neck:     Musculoskeletal: Neck supple.  Cardiovascular:     Rate and Rhythm: Normal rate and regular rhythm.     Pulses: Normal pulses.     Heart sounds: Normal heart sounds. No murmur. No friction rub. No gallop.   Pulmonary:     Effort: Pulmonary effort is normal.  Breath sounds: Normal breath sounds.  Abdominal:     General: Abdomen is flat. Bowel sounds are normal. There is no distension.     Palpations: Abdomen is soft.  Musculoskeletal:     Comments: Tenderness to palpation over anterior aspect of left shoulder with no erythema, edema, and warmth. No crepitus or deformity. Full passive ROM of left shoulder, limited active due to pain. 5/5 strength. Distal sensation and pulses intact.   Skin:    General: Skin is warm and dry.  Neurological:     General: No focal deficit present.     Mental Status: He is alert.      ED Treatments / Results  Labs (all labs ordered are listed, but only abnormal results are displayed) Labs Reviewed - No data to display  EKG None  Radiology Dg Shoulder Left  Result Date: 11/18/2019  CLINICAL DATA:  Left shoulder pain. Worsening symptoms over the last month. No acute injury or prior relevant surgery. EXAM: LEFT SHOULDER - 2+ VIEW COMPARISON:  None. FINDINGS: The mineralization and alignment are normal. There is no evidence of acute fracture or dislocation. The subacromial space is preserved. There are mild acromioclavicular degenerative changes. IMPRESSION: No acute osseous findings. Mild acromioclavicular degenerative changes. Electronically Signed   By: Carey Bullocks M.D.   On: 11/18/2019 09:36    Procedures Procedures (including critical care time)  Medications Ordered in ED Medications  ibuprofen (ADVIL) tablet 800 mg (800 mg Oral Given 11/18/19 6256)     Initial Impression / Assessment and Plan / ED Course  I have reviewed the triage vital signs and the nursing notes.  Pertinent labs & imaging results that were available during my care of the patient were reviewed by me and considered in my medical decision making (see chart for details).       53 year old male presents to the ED for evaluation of left shoulder pain for the past few months that has gradually worsened.  Patient is afebrile, nonseptic, non-ill appearing.  Patient in no acute distress.  Left shoulder with anterior tenderness to palpation.  No erythema, edema, warmth.  Full passive ROM.  Neurovascularly intact.  Suspect rotator cuff injury vs. Ligament injury vs. arthritis. Will obtain x-ray to rule out bony fractures. No concern for septic arthritis at this time.   Shoulder x-ray personally reviewed which is negative for bony fractures, but demonstrates mild acromioclavicular degenerative changes.  Will discharge home with meloxicam and sports medicine follow-up.  Patient advised to schedule an appointment for further evaluation of left shoulder pain.  Patient advised not to take other over-the-counter medications while on the meloxicam.  Patient denies history of GI bleed or ulcers. Strict ED  precautions discussed with patient. Patient states understanding and agrees to plan. Patient discharged home in no acute distress and stable vitals   Final Clinical Impressions(s) / ED Diagnoses   Final diagnoses:  Acute pain of left shoulder    ED Discharge Orders         Ordered    meloxicam (MOBIC) 7.5 MG tablet  Daily     11/18/19 0952           Renee Harder, PA-C 11/18/19 1006    Cur[place]olo, Adam, DO 11/18/19 1007

## 2019-11-18 NOTE — Discharge Instructions (Signed)
As discussed, nothing is broken in your shoulder, but it did show some arthritis. I have provided you with a number for a sport medicine doctor here in [place]. I would call to schedule an appointment for further evaluation of your shoulder pain. I am giving you Meloxicam. You may take 15mg  everyday as needed for pain. Do not mix with other over the counter medications. Return to the ER for new or worsening symptoms.

## 2019-11-18 NOTE — ED Triage Notes (Signed)
Left shoulder pain for couple months but gets worst after moving furniture yesterday.

## 2019-12-14 ENCOUNTER — Other Ambulatory Visit: Payer: Self-pay

## 2019-12-14 ENCOUNTER — Emergency Department (HOSPITAL_COMMUNITY)
Admission: EM | Admit: 2019-12-14 | Discharge: 2019-12-15 | Discharge status: Against Medical Advice | Payer: Medicaid Other | Attending: Emergency Medicine | Admitting: Emergency Medicine

## 2019-12-14 ENCOUNTER — Encounter (HOSPITAL_COMMUNITY): Payer: Self-pay | Admitting: Emergency Medicine

## 2019-12-14 ENCOUNTER — Emergency Department (HOSPITAL_COMMUNITY): Payer: Medicaid Other

## 2019-12-14 DIAGNOSIS — Z5321 Procedure and treatment not carried out due to patient leaving prior to being seen by health care provider: Secondary | ICD-10-CM | POA: Insufficient documentation

## 2019-12-14 DIAGNOSIS — M25511 Pain in right shoulder: Secondary | ICD-10-CM | POA: Diagnosis present
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
## 2019-12-14 NOTE — ED Notes (Signed)
Called pt to reassess vitals x3 and had no answer. 

## 2019-12-14 NOTE — ED Notes (Signed)
Pt ambulatory to sort desk inquiring about wait, pt asking to leave, encourage to stay.

## 2019-12-14 NOTE — ED Notes (Signed)
Pt called three times, no answer.  

## 2019-12-24 ENCOUNTER — Emergency Department (HOSPITAL_BASED_OUTPATIENT_CLINIC_OR_DEPARTMENT_OTHER)
Admission: EM | Admit: 2019-12-24 | Discharge: 2019-12-24 | Disposition: A | Discharge status: Home / Self Care | Payer: Medicaid Other | Attending: Emergency Medicine | Admitting: Emergency Medicine

## 2019-12-24 ENCOUNTER — Emergency Department (HOSPITAL_BASED_OUTPATIENT_CLINIC_OR_DEPARTMENT_OTHER): Payer: Medicaid Other

## 2019-12-24 ENCOUNTER — Encounter (HOSPITAL_BASED_OUTPATIENT_CLINIC_OR_DEPARTMENT_OTHER): Payer: Self-pay | Admitting: Emergency Medicine

## 2019-12-24 ENCOUNTER — Other Ambulatory Visit: Payer: Self-pay

## 2019-12-24 DIAGNOSIS — Y999 Unspecified external cause status: Secondary | ICD-10-CM | POA: Insufficient documentation

## 2019-12-24 DIAGNOSIS — S2242XA Multiple fractures of ribs, left side, initial encounter for closed fracture: Secondary | ICD-10-CM

## 2019-12-24 DIAGNOSIS — Z7984 Long term (current) use of oral hypoglycemic drugs: Secondary | ICD-10-CM | POA: Diagnosis not present

## 2019-12-24 DIAGNOSIS — Z79899 Other long term (current) drug therapy: Secondary | ICD-10-CM | POA: Insufficient documentation

## 2019-12-24 DIAGNOSIS — F17210 Nicotine dependence, cigarettes, uncomplicated: Secondary | ICD-10-CM | POA: Insufficient documentation

## 2019-12-24 DIAGNOSIS — Y9361 Activity, american tackle football: Secondary | ICD-10-CM | POA: Insufficient documentation

## 2019-12-24 DIAGNOSIS — S299XXA Unspecified injury of thorax, initial encounter: Secondary | ICD-10-CM | POA: Diagnosis present

## 2019-12-24 DIAGNOSIS — E119 Type 2 diabetes mellitus without complications: Secondary | ICD-10-CM | POA: Diagnosis not present

## 2019-12-24 DIAGNOSIS — W500XXA Accidental hit or strike by another person, initial encounter: Secondary | ICD-10-CM | POA: Insufficient documentation

## 2019-12-24 DIAGNOSIS — Y9289 Other specified places as the place of occurrence of the external cause: Secondary | ICD-10-CM | POA: Insufficient documentation

## 2019-12-24 MED ORDER — IBUPROFEN 800 MG PO TABS: 800.0000 mg | ORAL_TABLET | Freq: Two times a day (BID) | ORAL | 0 refills | Status: AC

## 2019-12-24 MED FILL — IBUPROFEN 800 MG TAB: 800 | 5 days supply | Qty: 10 | Fill #0
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
## 2019-12-24 NOTE — ED Triage Notes (Addendum)
Ongoing L rib pain and R upper arm following injury while playing football 1 week ago.

## 2019-12-24 NOTE — ED Provider Notes (Addendum)
[facility] EMERGENCY DEPARTMENT Provider Note   CSN: [id number] Arrival date & time: 12/24/19  1401     History Chief Complaint  Patient presents with  . Rib pain  . Arm Pain    Tony Patel. is a 54 y.o. male history of diabetes, sciatica presents today for left rib pain that began on 12/13/2019.  Patient reports that he was playing football with his son at the time of injury, he was tackled, struck in the left ribs.  He reports immediate onset sharp throbbing pain moderate in intensity nonradiating gradually improving over the past 1 week.   Additionally patient with right upper arm pain, he reports on my evaluation his pain is resolved and he has no pain of this area.  Did have pain initially after injury.  He denies any head injury, loss of consciousness, blood thinner use, vision changes, neck pain, numbness/weakness, tingling, chest pain, shortness of breath, abdominal pain, nausea/vomiting, diarrhea, swelling/color change, wound or any additional concerns.  Patient reports he has been managing his pain with his daily pain meds that he receives for his sciatica. ====== Additionally patient was seen in the ER on 12/14/2019, left without being seen.  At that time had imaging of right shoulder and chest obtained.  DG Chest 2 view: IMPRESSION: 1. No active cardiopulmonary disease. 2. No visible rib fracture or other osseous abnormality identified.  DG Right Shoulder: IMPRESSION: No acute osseous abnormality about the right shoulder.  Of note patient denies assault reports he was playing football with his son. HPI     Past Medical History:  Diagnosis Date  . Back pain   . Diabetes mellitus without complication (HCC)   . Sciatica     There are no problems to display for this patient.   Past Surgical History:  Procedure Laterality Date  . TONSILLECTOMY         No family history on file.  Social History   Tobacco Use  . Smoking status: Current  Every Day Smoker    Packs/day: 0.50  . Smokeless tobacco: Never Used  Substance Use Topics  . Alcohol use: Yes    Comment: occ  . Drug use: No    Home Medications Prior to Admission medications   Medication Sig Start Date End Date Taking? Authorizing Provider  HYDROcodone-acetaminophen (NORCO) 10-325 MG tablet Take 1 tablet by mouth every 4 (four) hours as needed.    [provider]  ibuprofen (ADVIL) 800 MG tablet Take 1 tablet (800 mg total) by mouth 2 (two) times daily. 12/24/19   [name] A, PA-C  METFORMIN HCL PO Take 1,000 mg by mouth 2 (two) times daily.     [provider]    Allergies    Patient has no known allergies.  Review of Systems   Review of Systems Ten systems are reviewed and are negative for acute change except as noted in the HPI  Physical Exam Updated Vital Signs BP 130/90 (BP Location: Left Arm)   Pulse 88   Temp 98.5 F (36.9 C) (Oral)   Resp 16   Ht 5\' 10"  (1.778 m)   Wt 90.7 kg   SpO2 98%   BMI 28.70 kg/m   Physical Exam Constitutional:      General: He is not in acute distress.    Appearance: Normal appearance. He is well-developed. He is not ill-appearing or diaphoretic.  HENT:     Head: Normocephalic and atraumatic. No raccoon eyes or Battle's sign.  Jaw: There is normal jaw occlusion. No trismus.     Right Ear: Tympanic membrane and external ear normal. No hemotympanum.     Left Ear: Tympanic membrane and external ear normal. No hemotympanum.     Nose: Nose normal. No rhinorrhea.     Right Nostril: No epistaxis.     Left Nostril: No epistaxis.     Mouth/Throat:     Mouth: Mucous membranes are moist.     Pharynx: Oropharynx is clear.  Eyes:     General: Vision grossly intact. Gaze aligned appropriately.     Extraocular Movements: Extraocular movements intact.     Conjunctiva/sclera: Conjunctivae normal.     Pupils: Pupils are equal, round, and reactive to light.     Comments: Visual fields grossly intact  bilaterally.  Neck:     Trachea: Trachea and phonation normal. No tracheal tenderness or tracheal deviation.  Pulmonary:     Effort: Pulmonary effort is normal. No respiratory distress.  Chest:     Chest wall: Tenderness present. No deformity or crepitus.       Comments: Tenderness to palpation of the left lower ribs. Abdominal:     General: There is no distension.     Palpations: Abdomen is soft.     Tenderness: There is no abdominal tenderness. There is no guarding or rebound.     Comments: No sign of injury of the abdomen  Musculoskeletal:        General: Normal range of motion.     Cervical back: Full passive range of motion without pain, normal range of motion and neck supple. No spinous process tenderness or muscular tenderness.     Comments: No midline C/T/L spinal tenderness to palpation, no paraspinal muscle tenderness, no deformity, crepitus, or step-off noted. No sign of injury to the neck or back. - Ambulatory around room without assistance or difficulty. - Range of motion and strength intact and appropriate for age with movements of the bilateral shoulders, elbows, wrists and fingers.  Sensation and capillary refill intact to all fingers.  Radial pulse intact and equal bilaterally.  Compartments soft bilaterally.  No evidence of injury over swelling bilaterally.  No gross ligamentous instability.  No tenderness to palpation bilaterally.  Skin:    General: Skin is warm and dry.  Neurological:     Mental Status: He is alert.     GCS: GCS eye subscore is 4. GCS verbal subscore is 5. GCS motor subscore is 6.     Comments: Speech is clear and goal oriented, follows commands Major Cranial nerves without deficit, no facial droop Moves extremities without ataxia, coordination intact  Psychiatric:        Behavior: Behavior normal.     ED Results / Procedures / Treatments   Labs (all labs ordered are listed, but only abnormal results are displayed) Labs Reviewed - No data to  display  EKG None  Radiology DG Ribs Unilateral W/Chest Left  Result Date: 12/24/2019 CLINICAL DATA:  Left-sided rib pain for the past week after playing football. EXAM: LEFT RIBS AND CHEST - 3+ VIEW COMPARISON:  Chest x-ray dated December 14, 2019. FINDINGS: Acute minimally displaced fractures of the left anterolateral fourth and fifth ribs. The heart size and mediastinal contours are within normal limits. Normal pulmonary vascularity. No focal consolidation, pleural effusion, or pneumothorax. IMPRESSION: 1. Acute minimally displaced fractures of the left anterolateral fourth and fifth ribs. 2.  No active cardiopulmonary disease. Electronically Signed   By: Vickki Hearing.D.  On: 12/24/2019 14:52   DG Humerus Right  Result Date: 12/24/2019 CLINICAL DATA:  Right upper arm pain for the past week after playing football. EXAM: RIGHT HUMERUS - 2+ VIEW COMPARISON:  Right shoulder x-rays dated December 14, 2019. FINDINGS: There is no evidence of fracture or other focal bone lesions. Unchanged acromioclavicular osteoarthritis. Soft tissues are unremarkable. IMPRESSION: No acute osseous abnormality. Electronically Signed   By: Titus Dubin M.D.   On: 12/24/2019 14:48    Procedures Procedures (including critical care time)  Medications Ordered in ED Medications - No data to display  ED Course  I have reviewed the triage vital signs and the nursing notes.  Pertinent labs & imaging results that were available during my care of the patient were reviewed by me and considered in my medical decision making (see chart for details).    MDM Rules/Calculators/A&P                     Triage work-up--  DG Right Humerus:  IMPRESSION:  No acute osseous abnormality.   DG Ribs with chest left:  IMPRESSION:  1. Acute minimally displaced fractures of the left anterolateral  fourth and fifth ribs.  2. No active cardiopulmonary disease.   ---- I have personally reviewed x-ray imaging from  patient's visit today as well as from his visit on 12/14/2019.  Patient with 2 rib fractures of left ribs as above no acute injury of the right humerus or right shoulder seen.  PDMP reviewed patient receives monthly Norco prescriptions.  Discussed with patient that would be inappropriate to provide more narcotic prescriptions.  Advised that he may follow-up with his PCP and pain doctor for follow-up of his rib fractures.  Incentive spirometer given by respiratory therapy.  Otherwise patient is well-appearing in no acute distress.  Bilateral upper extremities are neurovascular intact with good capillary refill and sensation.  Movements of all major joints intact without gross laxity or pain.  No evidence of cellulitis, septic arthritis, DVT, compartment syndrome or other acute abnormalities at this time.  Additionally there is no sign of head injury, he has no headache, neck or back pain. There is no indication for further work-up in the ED at this time.  At this time there does not appear to be any evidence of an acute emergency medical condition and the patient appears stable for discharge with appropriate outpatient follow up. Diagnosis was discussed with patient who verbalizes understanding of care plan and is agreeable to discharge. I have discussed return precautions with patient who verbalizes understanding of return precautions. Patient encouraged to follow-up with their PCP. All questions answered.  Addendum: Patient requesting ibuprofen 800 mg at discharge.  No history of CKD or gastric ulcers, review of medical records shows last CMP in 2018 with normal creatinine.  800 mg daily x5 days ibuprofen prescribed, precautions given.  Note: Portions of this report may have been transcribed using voice recognition software. Every effort was made to ensure accuracy; however, inadvertent computerized transcription errors may still be present. Final Clinical Impression(s) / ED Diagnoses Final diagnoses:    Closed fracture of multiple ribs of left side, initial encounter    Rx / DC Orders ED Discharge Orders         Ordered    ibuprofen (ADVIL) 800 MG tablet  2 times daily     12/24/19 1556              Deliah Boston, [place] 12/24/19 1558  Rolan Bucco, MD 12/24/19 [phone]

## 2020-02-22 ENCOUNTER — Encounter (HOSPITAL_BASED_OUTPATIENT_CLINIC_OR_DEPARTMENT_OTHER): Payer: Self-pay | Admitting: *Deleted

## 2020-02-22 ENCOUNTER — Emergency Department (HOSPITAL_BASED_OUTPATIENT_CLINIC_OR_DEPARTMENT_OTHER)
Admission: EM | Admit: 2020-02-22 | Discharge: 2020-02-22 | Disposition: A | Discharge status: Home / Self Care | Payer: Medicaid Other | Attending: Emergency Medicine | Admitting: Emergency Medicine

## 2020-02-22 ENCOUNTER — Other Ambulatory Visit: Payer: Self-pay

## 2020-02-22 DIAGNOSIS — M79661 Pain in right lower leg: Secondary | ICD-10-CM | POA: Diagnosis present

## 2020-02-22 DIAGNOSIS — E119 Type 2 diabetes mellitus without complications: Secondary | ICD-10-CM | POA: Diagnosis not present

## 2020-02-22 DIAGNOSIS — L02415 Cutaneous abscess of right lower limb: Secondary | ICD-10-CM

## 2020-02-22 DIAGNOSIS — Z7984 Long term (current) use of oral hypoglycemic drugs: Secondary | ICD-10-CM | POA: Diagnosis not present

## 2020-02-22 DIAGNOSIS — Z79899 Other long term (current) drug therapy: Secondary | ICD-10-CM | POA: Insufficient documentation

## 2020-02-22 DIAGNOSIS — Z791 Long term (current) use of non-steroidal anti-inflammatories (NSAID): Secondary | ICD-10-CM | POA: Diagnosis not present

## 2020-02-22 DIAGNOSIS — L03115 Cellulitis of right lower limb: Secondary | ICD-10-CM | POA: Diagnosis not present

## 2020-02-22 DIAGNOSIS — F17210 Nicotine dependence, cigarettes, uncomplicated: Secondary | ICD-10-CM | POA: Diagnosis not present

## 2020-02-22 LAB — CBG MONITORING, ED: Glucose-Capillary: 342 mg/dL — ABNORMAL HIGH (ref 70–99)

## 2020-02-22 MED ORDER — DOXYCYCLINE HYCLATE 100 MG PO TABS
100.0000 mg | ORAL_TABLET | Freq: Once | ORAL | Status: AC
  Administered 2020-02-22: 100 mg via ORAL
  Filled 2020-02-22: qty 1

## 2020-02-22 MED ORDER — DOXYCYCLINE HYCLATE 100 MG PO CAPS: 100.0000 mg | ORAL_CAPSULE | Freq: Two times a day (BID) | ORAL | 0 refills | Status: DC

## 2020-02-22 NOTE — ED Triage Notes (Signed)
Abscess to his right lower leg and left axilla.

## 2020-02-22 NOTE — Discharge Instructions (Signed)
If you develop fever, worsening pain, worsening redness or swelling, vomiting, or any other new/concerning symptoms then return to the ER for evaluation.

## 2020-02-22 NOTE — ED Provider Notes (Signed)
[facility] EMERGENCY DEPARTMENT Provider Note   CSN: [id number] Arrival date & time: 02/22/20  1738     History Chief Complaint  Patient presents with  . Abscess    Tony Patel. is a 54 y.o. male.  HPI 54 year old male with a history of frequent abscesses presents with recurrent abscess.  Over the last couple days has noticed right calf pain and swelling and redness.  No fevers or vomiting.  Has had poorly controlled glucose which is a chronic problem.  Glucose typically runs in the 300s.  Also noted some abscesses to his left axilla for a couple weeks but recently they drained and are much smaller.  Past Medical History:  Diagnosis Date  . Back pain   . Diabetes mellitus without complication ([place])   . Sciatica     There are no problems to display for this patient.   Past Surgical History:  Procedure Laterality Date  . TONSILLECTOMY         No family history on file.  Social History   Tobacco Use  . Smoking status: Current Every Day Smoker    Packs/day: 0.50  . Smokeless tobacco: Never Used  Substance Use Topics  . Alcohol use: Yes    Comment: occ  . Drug use: No    Home Medications Prior to Admission medications   Medication Sig Start Date End Date Taking? Authorizing Provider  HYDROcodone-acetaminophen (NORCO) 10-325 MG tablet Take 1 tablet by mouth every 4 (four) hours as needed.   Yes [provider]  doxycycline (VIBRAMYCIN) 100 MG capsule Take 1 capsule (100 mg total) by mouth 2 (two) times daily. One po bid x 7 days 02/22/20   Sherwood Gambler, MD  ibuprofen (ADVIL) 800 MG tablet Take 1 tablet (800 mg total) by mouth 2 (two) times daily. 12/24/19   Nuala Alpha A, PA-C  JANUVIA 50 MG tablet Take 50 mg by mouth daily. 12/07/19   [provider]  METFORMIN HCL PO Take 1,000 mg by mouth 2 (two) times daily.     [provider]  rosuvastatin (CRESTOR) 20 MG tablet Take 20 mg by mouth daily. 12/07/19   [provider]    Allergies    Patient has no known allergies.  Review of Systems   Review of Systems  Constitutional: Negative for fever.  Gastrointestinal: Negative for vomiting.  Skin: Positive for color change.  All other systems reviewed and are negative.   Physical Exam Updated Vital Signs BP 129/84 (BP Location: Right Arm)   Pulse 73   Temp 97.7 F (36.5 C) (Oral)   Resp 16   Ht 5\' 10"  (1.778 m)   Wt 86.2 kg   SpO2 100%   BMI 27.26 kg/m   Physical Exam Vitals and nursing note reviewed.  Constitutional:      Appearance: He is well-developed.  HENT:     Head: Normocephalic and atraumatic.     Right Ear: External ear normal.     Left Ear: External ear normal.     Nose: Nose normal.  Eyes:     General:        Right eye: No discharge.        Left eye: No discharge.  Cardiovascular:     Rate and Rhythm: Normal rate.  Pulmonary:     Effort: Pulmonary effort is normal.  Abdominal:     General: There is no distension.  Musculoskeletal:     Cervical back: Neck supple.  Legs:  Skin:    General: Skin is warm and dry.     Comments: Small raised lesions that are not cellulitic or fluctuant. C/w healing prior abscesses  Neurological:     Mental Status: He is alert.  Psychiatric:        Mood and Affect: Mood is not anxious.     ED Results / Procedures / Treatments   Labs (all labs ordered are listed, but only abnormal results are displayed) Labs Reviewed  CBG MONITORING, ED - Abnormal; Notable for the following components:      Result Value   Glucose-Capillary 342 (*)    All other components within normal limits    EKG None  Radiology No results found.  Procedures Ultrasound ED Soft Tissue  Date/Time: 02/22/2020 8:44 PM Performed by: Pricilla Loveless, MD Authorized by: Pricilla Loveless, MD   Procedure details:    Indications: localization of abscess and evaluate for cellulitis     Transverse view:  Visualized   Longitudinal view:   Visualized   Images: archived   Location:    Location: lower extremity     Side:  Right Findings:     abscess present    cellulitis present Comments:     Very small areas of early abscess   (including critical care time)  Medications Ordered in ED Medications  doxycycline (VIBRA-TABS) tablet 100 mg (has no administration in time range)    ED Course  I have reviewed the triage vital signs and the nursing notes.  Pertinent labs & imaging results that were available during my care of the patient were reviewed by me and considered in my medical decision making (see chart for details).    MDM Rules/Calculators/A&P                      Patient has recurrent abscesses.  Probably from poorly controlled diabetes.  I did an ultrasound today that shows very small fluid collection.  Discussed risks and benefits and he does not want to go through with I&D given how small it is.  Is more cellulitis, will cover for MRSA with doxycycline.  This would also cover his healing abscesses of his axilla.  Overall vitals are stable.  He has chronic hyperglycemia and poor diabetic control.  However I doubt sepsis with a normal vital signs.  Discharged home with return precautions. Final Clinical Impression(s) / ED Diagnoses Final diagnoses:  Cellulitis and abscess of right leg    Rx / DC Orders ED Discharge Orders         Ordered    doxycycline (VIBRAMYCIN) 100 MG capsule  2 times daily     02/22/20 2041           Pricilla Loveless, MD 02/22/20 2044

## 2020-03-26 ENCOUNTER — Other Ambulatory Visit: Payer: Self-pay

## 2020-03-26 ENCOUNTER — Encounter (HOSPITAL_BASED_OUTPATIENT_CLINIC_OR_DEPARTMENT_OTHER): Payer: Self-pay | Admitting: Emergency Medicine

## 2020-03-26 ENCOUNTER — Emergency Department (HOSPITAL_BASED_OUTPATIENT_CLINIC_OR_DEPARTMENT_OTHER)
Admission: EM | Admit: 2020-03-26 | Discharge: 2020-03-26 | Disposition: A | Discharge status: Home / Self Care | Payer: Medicaid Other | Attending: Emergency Medicine | Admitting: Emergency Medicine

## 2020-03-26 DIAGNOSIS — M79604 Pain in right leg: Secondary | ICD-10-CM | POA: Diagnosis present

## 2020-03-26 DIAGNOSIS — Z7984 Long term (current) use of oral hypoglycemic drugs: Secondary | ICD-10-CM | POA: Insufficient documentation

## 2020-03-26 DIAGNOSIS — F17210 Nicotine dependence, cigarettes, uncomplicated: Secondary | ICD-10-CM | POA: Diagnosis not present

## 2020-03-26 DIAGNOSIS — L03115 Cellulitis of right lower limb: Secondary | ICD-10-CM | POA: Diagnosis not present

## 2020-03-26 DIAGNOSIS — E119 Type 2 diabetes mellitus without complications: Secondary | ICD-10-CM | POA: Insufficient documentation

## 2020-03-26 DIAGNOSIS — Z79899 Other long term (current) drug therapy: Secondary | ICD-10-CM | POA: Diagnosis not present

## 2020-03-26 MED ORDER — DOXYCYCLINE HYCLATE 100 MG PO CAPS: 100.0000 mg | ORAL_CAPSULE | Freq: Two times a day (BID) | ORAL | 0 refills | Status: AC

## 2020-03-26 NOTE — Discharge Instructions (Signed)
Please use warm compresses several times daily on the affected areas.  Please take doxycycline twice daily as prescribed.  Please follow-up with your primary care doctor for referral to dermatology for further evaluation.  Please continue to check your blood sugar regularly.  Please discuss your blood sugar with your primary care doctor.

## 2020-03-26 NOTE — ED Provider Notes (Signed)
[facility] EMERGENCY DEPARTMENT Provider Note   CSN: [id number] Arrival date & time: 03/26/20  1419     History Chief Complaint  Patient presents with  . Abscess    Tony Patel. is a 54 y.o. male.  HPI  Patient is a 54 year old male with a history of frequent abscesses with a history of DM presented today with right hamstring pain, right armpit pain.  Patient is a 54 year old male with a history of frequent abscesses presented today with concern for abscess.  He states that he has had pain in the right hamstring for the past 2 days.  He states that it is red and swollen and tender to touch.  He denies any fevers, vomiting, headache, nausea, fatigue, malaise, weakness, abdominal pain.  He states he has poorly controlled blood sugar and states that his blood sugars often run over 300.  He states is a chronic problem denies any symptoms today.  Denies any vision changes.  He states he has not seen his primary care doctor for this since 4 months ago.  He states that he is on Januvia and Metformin.  He states he has been compliant with this.      Past Medical History:  Diagnosis Date  . Back pain   . Diabetes mellitus without complication ([place])   . Sciatica     There are no problems to display for this patient.   Past Surgical History:  Procedure Laterality Date  . TONSILLECTOMY         No family history on file.  Social History   Tobacco Use  . Smoking status: Current Every Day Smoker    Packs/day: 0.50  . Smokeless tobacco: Never Used  Substance Use Topics  . Alcohol use: Yes    Comment: occ  . Drug use: No    Home Medications Prior to Admission medications   Medication Sig Start Date End Date Taking? Authorizing Provider  doxycycline (VIBRAMYCIN) 100 MG capsule Take 1 capsule (100 mg total) by mouth 2 (two) times daily for 7 days. One po bid x 7 days 03/26/20 04/02/20  Tedd Sias, PA  HYDROcodone-acetaminophen (NORCO) 10-325 MG tablet Take 1  tablet by mouth every 4 (four) hours as needed.    [provider]  ibuprofen (ADVIL) 800 MG tablet Take 1 tablet (800 mg total) by mouth 2 (two) times daily. 12/24/19   Nuala Alpha A, PA-C  JANUVIA 50 MG tablet Take 50 mg by mouth daily. 12/07/19   [provider]  METFORMIN HCL PO Take 1,000 mg by mouth 2 (two) times daily.     [provider]  rosuvastatin (CRESTOR) 20 MG tablet Take 20 mg by mouth daily. 12/07/19   [provider]    Allergies    Patient has no known allergies.  Review of Systems   Review of Systems  Constitutional: Negative for chills and fever.  HENT: Negative for congestion.   Eyes: Negative for pain.  Respiratory: Negative for cough and shortness of breath.   Cardiovascular: Negative for chest pain and leg swelling.  Gastrointestinal: Negative for abdominal pain and vomiting.  Genitourinary: Negative for dysuria.  Musculoskeletal: Negative for myalgias.  Skin: Negative for rash.       "Abscess "  Neurological: Negative for dizziness and headaches.    Physical Exam Updated Vital Signs BP (!) 132/94 (BP Location: Right Arm)   Pulse 88   Temp 98 F (36.7 C)   Resp 18   Ht  5\' 10"  (1.778 m)   Wt 88.5 kg   SpO2 99%   BMI 27.98 kg/m   Physical Exam Vitals and nursing note reviewed.  Constitutional:      General: He is not in acute distress.    Appearance: Normal appearance. He is not ill-appearing or toxic-appearing.     Comments: Patient is 54 year old male appears older than stated age.  Pleasant, able answer questions properly follow commands.  HENT:     Head: Normocephalic and atraumatic.     Mouth/Throat:     Mouth: Mucous membranes are moist.  Eyes:     General: No scleral icterus.       Right eye: No discharge.        Left eye: No discharge.     Conjunctiva/sclera: Conjunctivae normal.  Cardiovascular:     Rate and Rhythm: Normal rate and regular rhythm.  Pulmonary:     Effort: Pulmonary effort is  normal.     Breath sounds: No stridor.  Abdominal:     Tenderness: There is no abdominal tenderness. There is no right CVA tenderness, left CVA tenderness or guarding.  Musculoskeletal:     Cervical back: Normal range of motion and neck supple.  Skin:      Neurological:     Mental Status: He is alert and oriented to person, place, and time. Mental status is at baseline.     ED Results / Procedures / Treatments   Labs (all labs ordered are listed, but only abnormal results are displayed) Labs Reviewed - No data to display  EKG None  Radiology No results found.  Procedures Procedures (including critical care time)  Medications Ordered in ED Medications - No data to display  ED Course  I have reviewed the triage vital signs and the nursing notes.  Pertinent labs & imaging results that were available during my care of the patient were reviewed by me and considered in my medical decision making (see chart for details).    MDM Rules/Calculators/A&P                      Patient is a 54 year old diabetic with a history of recurrent abscesses.  He has poorly controlled diabetes he does follow with a PCP however and is on Januvia and Metformin.  He states he takes these as prescribed and states his blood pressure is really below 300.  Discussed risks and benefits of incision and drainage of nonfluctuant areas of his skin today.  He states he would prefer to not have incision and drainage done today.  I recommended antibiotics and prescribed him doxycycline twice daily for 7 days.  He will use warm compresses, warm salt water soaks and good hygiene.  Recommended following up with his PCP and recommended referral to dermatology by PCP for further evaluation of his recurrent abscesses.  Patient is afebrile with no infectious symptoms.  He is well-appearing.  Final Clinical Impression(s) / ED Diagnoses Final diagnoses:  Cellulitis of right lower extremity    Rx / DC Orders ED  Discharge Orders         Ordered    doxycycline (VIBRAMYCIN) 100 MG capsule  2 times daily     03/26/20 1446           05/26/20, Gailen Shelter 03/26/20 1514    05/26/20, DO 03/27/20 [id number]

## 2020-03-26 NOTE — ED Triage Notes (Signed)
Pt c/o multiple abscesses to legs.

## 2020-04-08 ENCOUNTER — Emergency Department (HOSPITAL_BASED_OUTPATIENT_CLINIC_OR_DEPARTMENT_OTHER)
Admission: EM | Admit: 2020-04-08 | Discharge: 2020-04-08 | Disposition: A | Discharge status: Home / Self Care | Payer: Medicaid Other | Attending: Emergency Medicine | Admitting: Emergency Medicine

## 2020-04-08 ENCOUNTER — Encounter (HOSPITAL_BASED_OUTPATIENT_CLINIC_OR_DEPARTMENT_OTHER): Payer: Self-pay | Admitting: *Deleted

## 2020-04-08 ENCOUNTER — Other Ambulatory Visit: Payer: Self-pay

## 2020-04-08 DIAGNOSIS — Z7984 Long term (current) use of oral hypoglycemic drugs: Secondary | ICD-10-CM | POA: Insufficient documentation

## 2020-04-08 DIAGNOSIS — F17210 Nicotine dependence, cigarettes, uncomplicated: Secondary | ICD-10-CM | POA: Insufficient documentation

## 2020-04-08 DIAGNOSIS — E119 Type 2 diabetes mellitus without complications: Secondary | ICD-10-CM | POA: Diagnosis not present

## 2020-04-08 DIAGNOSIS — R2242 Localized swelling, mass and lump, left lower limb: Secondary | ICD-10-CM | POA: Diagnosis present

## 2020-04-08 DIAGNOSIS — L02416 Cutaneous abscess of left lower limb: Secondary | ICD-10-CM | POA: Insufficient documentation

## 2020-04-08 DIAGNOSIS — Z79899 Other long term (current) drug therapy: Secondary | ICD-10-CM | POA: Insufficient documentation

## 2020-04-08 HISTORY — DX: Cutaneous abscess, unspecified: L02.91

## 2020-04-08 LAB — CBG MONITORING, ED: Glucose-Capillary: 326 mg/dL — ABNORMAL HIGH (ref 70–99)

## 2020-04-08 MED ORDER — CHLORHEXIDINE GLUCONATE 4 % EX LIQD: Freq: Every day | CUTANEOUS | 0 refills | Status: DC | PRN

## 2020-04-08 MED ORDER — LIDOCAINE VISCOUS HCL 2 % MT SOLN: 15.0000 mL | Freq: Once | OROMUCOSAL | Status: DC

## 2020-04-08 NOTE — ED Triage Notes (Signed)
Abscess to left leg and top of head

## 2020-04-08 NOTE — ED Notes (Signed)
ED Provider at bedside. 

## 2020-04-08 NOTE — Discharge Instructions (Addendum)
You were seen in the ER for a abscess of your left leg.  As discussed, your abscess is not very fluctuant, therefore we will not be cutting open and draining it.  It is very important for you to maintain good control of your blood sugars.  Make sure you take your diabetes medication as prescribed and follow-up with your primary care doctor soon as possible as the cause of your frequent abscesses is likely poor blood sugar control.  I have prescribed a soap that will help disinfect the areas.  Please use the soap once a day in the shower.  Return to the ER if your symptoms worsen.

## 2020-04-08 NOTE — ED Provider Notes (Signed)
[facility] EMERGENCY DEPARTMENT Provider Note   CSN: [id number] Arrival date & time: 04/08/20  9935     History Chief Complaint  Patient presents with  . Abscess    Tony Nichol. is a 54 y.o. male.  HPI 54 year old male with a history of frequent abscesses, DM type II on Metformin and Januvia presents to the ER for concern of abscess.  Patient was seen in the ER on 03/26/2020 for an abscess on his right hamstring, he was given a prescription of doxycycline as an I&D was not indicated at the time.  Patient reports compliance with the medications, states he finished the course just a few days ago.  While he was on the antibiotic, he developed another small abscess on his lower left outer leg.  Also thinks that he has an abscess on the top of his head.  He denies fever, chills.  He states that he does not take his blood sugars at home but is compliant with both medications.  He states he follows with his PCP every 3 months.    Past Medical History:  Diagnosis Date  . Abscess   . Back pain   . Diabetes mellitus without complication (HCC)   . Sciatica     There are no problems to display for this patient.     Past Surgical History:  Procedure Laterality Date  . TONSILLECTOMY         History reviewed. No pertinent family history.  Social History   Tobacco Use  . Smoking status: Current Every Day Smoker    Packs/day: 0.50  . Smokeless tobacco: Never Used  Substance Use Topics  . Alcohol use: Yes    Comment: occ  . Drug use: No    Home Medications Prior to Admission medications   Medication Sig Start Date End Date Taking? Authorizing Provider  JANUVIA 50 MG tablet Take 50 mg by mouth daily. 12/07/19  Yes [provider]  METFORMIN HCL PO Take 1,000 mg by mouth 2 (two) times daily.    Yes [provider]  chlorhexidine (HIBICLENS) 4 % external liquid Apply topically daily as needed. 04/08/20   Mare Ferrari, PA-C  ibuprofen (ADVIL)  800 MG tablet Take 1 tablet (800 mg total) by mouth 2 (two) times daily. 12/24/19   Bill Salinas, PA-C    Allergies    Patient has no known allergies.  Review of Systems   Review of Systems  Constitutional: Negative for chills and fever.  Endocrine: Negative for polydipsia, polyphagia and polyuria.  Musculoskeletal: Negative for joint swelling.  Skin: Positive for color change and wound. Negative for pallor and rash.    Physical Exam Updated Vital Signs BP (!) 145/92 (BP Location: Right Arm)   Pulse 76   Temp (!) 97.5 F (36.4 C) (Oral)   Resp 18   Ht 5\' 10"  (1.778 m)   Wt 87.7 kg   SpO2 99%   BMI 27.75 kg/m   Physical Exam Vitals and nursing note reviewed.  Constitutional:      General: He is not in acute distress.    Appearance: He is well-developed. He is obese. He is not ill-appearing or toxic-appearing.  HENT:     Head: Normocephalic and atraumatic.     Mouth/Throat:     Mouth: Mucous membranes are moist.     Pharynx: Oropharynx is clear.  Eyes:     Extraocular Movements: Extraocular movements intact.     Conjunctiva/sclera: Conjunctivae normal.  Pupils: Pupils are equal, round, and reactive to light.  Cardiovascular:     Rate and Rhythm: Normal rate and regular rhythm.     Pulses: Normal pulses.     Heart sounds: Normal heart sounds. No murmur.  Pulmonary:     Effort: Pulmonary effort is normal. No respiratory distress.     Breath sounds: Normal breath sounds.  Abdominal:     General: Abdomen is flat. Bowel sounds are normal.     Palpations: Abdomen is soft.     Tenderness: There is no abdominal tenderness.  Musculoskeletal:        General: Normal range of motion.     Cervical back: Normal range of motion and neck supple.  Skin:    General: Skin is warm and dry.     Findings: Erythema and lesion present.     Comments: 2cmx2cm non-fluctuant wound with surrounding erythema on left lower lateral leg just above the lateral malleolus..  No visible  discharge, foul smell, gangrene.  Small wound with visible scab, no fluctuance or surrounding erythema on crown of head. Please see photos in physical exam  Neurological:     General: No focal deficit present.     Mental Status: He is alert and oriented to person, place, and time.  Psychiatric:        Mood and Affect: Mood normal.        Behavior: Behavior normal.         ED Results / Procedures / Treatments   Labs (all labs ordered are listed, but only abnormal results are displayed) Labs Reviewed  CBG MONITORING, ED - Abnormal; Notable for the following components:      Result Value   Glucose-Capillary 326 (*)    All other components within normal limits    EKG None  Radiology No results found.  Procedures Procedures (including critical care time)  Medications Ordered in ED Medications - No data to display  ED Course  I have reviewed the triage vital signs and the nursing notes.  Pertinent labs & imaging results that were available during my care of the patient were reviewed by me and considered in my medical decision making (see chart for details).    MDM Rules/Calculators/A&P                     54 year old male with DM type II to the ER with concern for abscess. On presentation to the ER, patient is nontoxic appearing, in no acute distress.  On physical exam, abscess on left lateral leg is nonfluctuant, largely indurated.  Wound on top of head does not appear to be an abscess, likely a bug bite that the patient admits to scratching and itching it.  I&D not indicated at this time given the risks vs benefits, this was discussed with the patient and he is agreeable.  No signs of gangrene, deep soft tissue infection.  Patient reports finishing course of doxycycline just a few days ago, so I do not think antibiotic therapy is warranted at this time.  Had a long discussion about patient's diabetes control, CBG in the ER today 326.  Patient reports compliance with Metformin  and Januvia, though he does not check his blood sugars.  Admits to eating M&Ms and honey buns.  Discussed at length about the sequelae of diabetes, and the dangers of repeated abscesses which could lead to life-threatening infections and possible limb amputations.  Stressed close follow-up with PCP within the week in order  to reevaluate blood sugar control.  Discussed dietary and exercise modifications.  Will send home with chlorhexidine soap, patient instructed to use once a day over abscesses.  All patient's questions have been answered at this time, he voices understanding and is agreeable to this plan.  Patient was seen and evaluated by Dr. Particia Nearing and she is agreeable to the above plan.  Final Clinical Impression(s) / ED Diagnoses Final diagnoses:  Abscess of left lower leg    Rx / DC Orders ED Discharge Orders         Ordered    chlorhexidine (HIBICLENS) 4 % external liquid  Daily PRN     04/08/20 0934           Mare Ferrari, PA-C 04/08/20 1039    Jacalyn Lefevre, MD 04/08/20 1230

## 2020-04-10 ENCOUNTER — Other Ambulatory Visit: Payer: Self-pay

## 2020-04-10 ENCOUNTER — Emergency Department (HOSPITAL_BASED_OUTPATIENT_CLINIC_OR_DEPARTMENT_OTHER)
Admission: EM | Admit: 2020-04-10 | Discharge: 2020-04-10 | Disposition: A | Discharge status: Home / Self Care | Payer: Medicaid Other | Attending: Emergency Medicine | Admitting: Emergency Medicine

## 2020-04-10 ENCOUNTER — Encounter (HOSPITAL_BASED_OUTPATIENT_CLINIC_OR_DEPARTMENT_OTHER): Payer: Self-pay | Admitting: Emergency Medicine

## 2020-04-10 DIAGNOSIS — Z7984 Long term (current) use of oral hypoglycemic drugs: Secondary | ICD-10-CM | POA: Insufficient documentation

## 2020-04-10 DIAGNOSIS — L03116 Cellulitis of left lower limb: Secondary | ICD-10-CM | POA: Diagnosis not present

## 2020-04-10 DIAGNOSIS — E1165 Type 2 diabetes mellitus with hyperglycemia: Secondary | ICD-10-CM | POA: Diagnosis not present

## 2020-04-10 DIAGNOSIS — R739 Hyperglycemia, unspecified: Secondary | ICD-10-CM

## 2020-04-10 DIAGNOSIS — F17210 Nicotine dependence, cigarettes, uncomplicated: Secondary | ICD-10-CM | POA: Diagnosis not present

## 2020-04-10 DIAGNOSIS — L089 Local infection of the skin and subcutaneous tissue, unspecified: Secondary | ICD-10-CM | POA: Diagnosis present

## 2020-04-10 DIAGNOSIS — L02416 Cutaneous abscess of left lower limb: Secondary | ICD-10-CM | POA: Diagnosis not present

## 2020-04-10 LAB — CBG MONITORING, ED: Glucose-Capillary: 354 mg/dL — ABNORMAL HIGH (ref 70–99)

## 2020-04-10 MED ORDER — CLINDAMYCIN HCL 150 MG PO CAPS: 300.0000 mg | ORAL_CAPSULE | Freq: Three times a day (TID) | ORAL | 0 refills | Status: AC

## 2020-04-10 NOTE — ED Provider Notes (Signed)
[facility] EMERGENCY DEPARTMENT Provider Note   CSN: [id number] Arrival date & time: 04/10/20  1039     History Chief Complaint  Patient presents with  . Abscess    Tony Patel. is a 54 y.o. male with a history of non insulin dependent T2DM & recurrent skin infections who presents to the ED with complaints of LLE abscess x 1 week. Patient states he has a red painful area to the left lower leg. Seems to be getting progressively worse. Relay she developed this while he was on doxycycline for a different area of skin infection. No alleviating/aggravating factors. Denies fever, chills, nausea, vomiting, or visual disturbance. He states his sugars have been running high as they typically do.    Per chart review for additional history- seen in the ED 03/26/20 for RLE & R axilla infections felt to be celluitic- given doxycycline for 7 day course. Seen in the ED 04/08/20 for concern for LLE abscess- no abx at that time.     HPI     Past Medical History:  Diagnosis Date  . Abscess   . Back pain   . Diabetes mellitus without complication (HCC)   . Sciatica     There are no problems to display for this patient.   Past Surgical History:  Procedure Laterality Date  . TONSILLECTOMY         No family history on file.  Social History   Tobacco Use  . Smoking status: Current Every Day Smoker    Packs/day: 0.50  . Smokeless tobacco: Never Used  Substance Use Topics  . Alcohol use: Yes    Comment: occ  . Drug use: No    Home Medications Prior to Admission medications   Medication Sig Start Date End Date Taking? Authorizing Provider  chlorhexidine (HIBICLENS) 4 % external liquid Apply topically daily as needed. 04/08/20   Mare Ferrari, PA-C  ibuprofen (ADVIL) 800 MG tablet Take 1 tablet (800 mg total) by mouth 2 (two) times daily. 12/24/19   [name] A, PA-C  JANUVIA 50 MG tablet Take 50 mg by mouth daily. 12/07/19   [provider]  METFORMIN  HCL PO Take 1,000 mg by mouth 2 (two) times daily.     [provider]    Allergies    Patient has no known allergies.  Review of Systems   Review of Systems  Constitutional: Negative for chills and fever.  Respiratory: Negative for shortness of breath.   Cardiovascular: Negative for chest pain.  Gastrointestinal: Negative for abdominal pain, nausea and vomiting.  Endocrine: Negative for polyuria.  Skin: Positive for wound.  Neurological: Negative for weakness and numbness.    Physical Exam Updated Vital Signs BP (!) 121/98 (BP Location: Right Arm)   Pulse 80   Temp 98 F (36.7 C) (Oral)   Resp 18   SpO2 98%   Physical Exam Vitals and nursing note reviewed.  Constitutional:      General: He is not in acute distress.    Appearance: He is not ill-appearing or toxic-appearing.  HENT:     Head: Normocephalic and atraumatic.  Cardiovascular:     Rate and Rhythm: Normal rate.     Pulses:          Dorsalis pedis pulses are 2+ on the right side and 2+ on the left side.       Posterior tibial pulses are 2+ on the right side and 2+ on the left side.  Pulmonary:  Effort: Pulmonary effort is normal.  Musculoskeletal:     Comments: Lower extremities: left lower outer leg: there is an area of scabbing with surrounding erythema, warmth, & induration. No palpable fluctuance. No gross abscess. Mild tenderness to palpation. Otherwise nontender. Compartments are soft. Intact AROM throughout.    Skin:    General: Skin is warm and dry.     Capillary Refill: Capillary refill takes less than 2 seconds.  Neurological:     Mental Status: He is alert.     Comments: Alert. Clear speech. Sensation grossly intact to bilateral lower extremities. 5/5 strength with plantar/dorsiflexion bilaterally. Patient ambulatory.  Psychiatric:        Mood and Affect: Mood normal.        Behavior: Behavior normal.         ED Results / Procedures / Treatments   Labs (all labs ordered are  listed, but only abnormal results are displayed) Labs Reviewed  CBG MONITORING, ED - Abnormal; Notable for the following components:      Result Value   Glucose-Capillary 354 (*)    All other components within normal limits    EKG None  Radiology No results found.  Procedures Procedures (including critical care time)  Medications Ordered in ED Medications - No data to display  ED Course  I have reviewed the triage vital signs and the nursing notes.  Pertinent labs & imaging results that were available during my care of the patient were reviewed by me and considered in my medical decision making (see chart for details).    MDM Rules/Calculators/A&P                      Patient presents to the ED with painful LLE wound.  He is nontoxic, resting comfortably, vitals WNL with exception of mildly elevated blood pressure, doubt HTN emergency.  On exam there is no palpable fluctuance, does not seem consistent with abscess requiring ER I&D.  Area does appear to be worsening since last ED visit when comparing media files.  Concern for a degree of cellulitis.  This is a recurrent problem for the patient, his diabetes is frequently uncontrolled, CBG 354 today.  Education provided to the patient including wound care as well as need for better control of his diabetes and close primary care follow-up.  Will start on clindamycin per discussion with supervising physician Dr. Billy Fischer. I discussed results, treatment plan, need for follow-up, and return precautions with the patient. Provided opportunity for questions, patient confirmed understanding and is in agreement with plan.   Final Clinical Impression(s) / ED Diagnoses Final diagnoses:  Cellulitis of left lower extremity  Hyperglycemia    Rx / DC Orders ED Discharge Orders         Ordered    clindamycin (CLEOCIN) 150 MG capsule  3 times daily     04/10/20 [address] [place], PA-C 04/10/20 1148      Gareth Morgan, MD 04/10/20 2314

## 2020-04-10 NOTE — ED Triage Notes (Signed)
Abscess to L leg

## 2020-04-10 NOTE — ED Notes (Signed)
Pt discharged to home. Discharge instructions have been discussed with patient and/or family members. Pt verbally acknowledges understanding d/c instructions, and endorses comprehension to checkout at registration before leaving.  °

## 2020-04-10 NOTE — Discharge Instructions (Signed)
You were seen in the emergency department today for a skin infection.  Your blood sugar was noted to be high.  Please continue to take your diabetes medications.  Please follow-up very closely with your primary care provider within 48 hours for recheck of your blood sugar and possible adjustment of your diabetes medications.  We are sending you with clindamycin, an antibiotic, to treat the infection in your leg.  Please take all of your antibiotics until finished. You may develop abdominal discomfort or diarrhea from the antibiotic.  You may help offset this with probiotics which you can buy at the store (ask your pharmacist if unable to find) or get probiotics in the form of eating yogurt. Do not eat or take the probiotics until 2 hours after your antibiotic. If you are unable to tolerate these side effects follow-up with your primary care provider or return to the emergency department.   If you begin to experience any blistering, rashes, swelling, or difficulty breathing seek medical care for evaluation of potentially more serious side effects.   Please be aware that this medication may interact with other medications you are taking, please be sure to discuss your medication list with your pharmacist.   Please follow-up with your primary care provider within 48 hours for recheck of your blood sugar as well as a recheck of the area.  Return to the ER for new or worsening symptoms including but not limited to spreading redness, fever, increased pain, or any other concerns.

## 2020-07-11 ENCOUNTER — Other Ambulatory Visit: Payer: Self-pay

## 2020-07-11 ENCOUNTER — Emergency Department (HOSPITAL_BASED_OUTPATIENT_CLINIC_OR_DEPARTMENT_OTHER)
Admission: EM | Admit: 2020-07-11 | Discharge: 2020-07-11 | Disposition: A | Discharge status: Home / Self Care | Payer: Medicaid Other | Attending: Emergency Medicine | Admitting: Emergency Medicine

## 2020-07-11 ENCOUNTER — Encounter (HOSPITAL_BASED_OUTPATIENT_CLINIC_OR_DEPARTMENT_OTHER): Payer: Self-pay | Admitting: *Deleted

## 2020-07-11 DIAGNOSIS — L02215 Cutaneous abscess of perineum: Secondary | ICD-10-CM | POA: Insufficient documentation

## 2020-07-11 DIAGNOSIS — F17210 Nicotine dependence, cigarettes, uncomplicated: Secondary | ICD-10-CM | POA: Diagnosis not present

## 2020-07-11 DIAGNOSIS — E119 Type 2 diabetes mellitus without complications: Secondary | ICD-10-CM | POA: Insufficient documentation

## 2020-07-11 DIAGNOSIS — Z7984 Long term (current) use of oral hypoglycemic drugs: Secondary | ICD-10-CM | POA: Diagnosis not present

## 2020-07-11 DIAGNOSIS — L0291 Cutaneous abscess, unspecified: Secondary | ICD-10-CM

## 2020-07-11 MED ORDER — SULFAMETHOXAZOLE-TRIMETHOPRIM 800-160 MG PO TABS
1.0000 | ORAL_TABLET | Freq: Once | ORAL | Status: AC
  Administered 2020-07-11: 1 via ORAL
  Filled 2020-07-11: qty 1

## 2020-07-11 MED ORDER — MUPIROCIN CALCIUM 2 % NA OINT: TOPICAL_OINTMENT | NASAL | 0 refills | Status: DC

## 2020-07-11 MED ORDER — SULFAMETHOXAZOLE-TRIMETHOPRIM 800-160 MG PO TABS: 1.0000 | ORAL_TABLET | Freq: Two times a day (BID) | ORAL | 0 refills | Status: AC

## 2020-07-11 MED ORDER — LIDOCAINE-EPINEPHRINE 2 %-1:100000 IJ SOLN
20.0000 mL | Freq: Once | INTRAMUSCULAR | Status: AC
  Administered 2020-07-11: 20 mL via INTRADERMAL

## 2020-07-11 MED ORDER — LIDOCAINE-EPINEPHRINE (PF) 2 %-1:200000 IJ SOLN: 10.0000 mL | Freq: Once | INTRAMUSCULAR | Status: DC

## 2020-07-11 MED FILL — MUPIROCIN 2% OINTMENT: 2 | 5 days supply | Qty: 22 | Fill #0

## 2020-07-11 MED FILL — SULFAMETHOXAZOLE-TMP DS TAB: 800-160 | 7 days supply | Qty: 14 | Fill #0

## 2020-07-11 NOTE — ED Triage Notes (Signed)
Abscess on his buttocks x 3 days.

## 2020-07-11 NOTE — Discharge Instructions (Signed)
Please read and follow all provided instructions.  Your diagnoses today include:  1. Abscess     Tests performed today include:  Vital signs. See below for your results today.   Medications prescribed:   Bactrim (trimethoprim/sulfamethoxazole) - antibiotic  You have been prescribed an antibiotic medicine: take the entire course of medicine even if you are feeling better. Stopping early can cause the antibiotic not to work.  Take any prescribed medications only as directed.   Home care instructions:   Follow any educational materials contained in this packet  Follow-up instructions: Return to the Emergency Department in 48 hours for a recheck if your symptoms are not significantly improved.  Please follow-up with your primary care provider in the next 1 week for further evaluation of your symptoms.   Return instructions:  Return to the Emergency Department if you have:  Fever  Worsening symptoms  Worsening pain  Worsening swelling  Redness of the skin that moves away from the affected area, especially if it streaks away from the affected area   Any other emergent concerns  **Additional Information: If you have recurrent abscesses, try both the following. Use a Qtip to apply an over-the-counter antibiotic to the inside of your nostrils, twice a day for 5 days. Wash your body with over-the-counter Hibaclens once a day for one week and then once every two weeks. This can reduce the amount of bacterial on your skin that causes boils and lead to fewer boils. If you continue to have multiple or recurrent boils, you should see a dermatologist (skin doctor).   Your vital signs today were: BP (!) 127/96 (BP Location: Right Arm)    Pulse 94    Temp 98.1 F (36.7 C) (Oral)    Resp 18    Ht 5\' 10"  (1.778 m)    Wt 90.7 kg    SpO2 98%    BMI 28.70 kg/m  If your blood pressure (BP) was elevated above 135/85 this visit, please have this repeated by your doctor within one  month. --------------

## 2020-07-11 NOTE — ED Provider Notes (Signed)
[facility] EMERGENCY DEPARTMENT Provider Note   CSN: [id number] Arrival date & time: 07/11/20  1104     History Chief Complaint  Patient presents with  . Abscess    Tony Patel. is a 54 y.o. male.  Patient with history of diabetes, frequent abscesses presents to the emergency department with 3 days of worsening perineal abscess.  Patient has not noted any drainage.  No treatments prior to arrival.  No fevers, nausea, vomiting, or diarrhea.  Pain is worse with palpation and sitting.  Nothing makes it better.        Past Medical History:  Diagnosis Date  . Abscess   . Back pain   . Diabetes mellitus without complication (HCC)   . Sciatica     There are no problems to display for this patient.   Past Surgical History:  Procedure Laterality Date  . TONSILLECTOMY         No family history on file.  Social History   Tobacco Use  . Smoking status: Current Every Day Smoker    Packs/day: 0.50  . Smokeless tobacco: Never Used  Vaping Use  . Vaping Use: Never used  Substance Use Topics  . Alcohol use: Yes    Comment: occ  . Drug use: No    Home Medications Prior to Admission medications   Medication Sig Start Date End Date Taking? Authorizing Provider  JANUVIA 50 MG tablet Take 50 mg by mouth daily. 12/07/19  Yes [provider]  METFORMIN HCL PO Take 1,000 mg by mouth 2 (two) times daily.    Yes [provider]  chlorhexidine (HIBICLENS) 4 % external liquid Apply topically daily as needed. 04/08/20   Mare Ferrari, PA-C  ibuprofen (ADVIL) 800 MG tablet Take 1 tablet (800 mg total) by mouth 2 (two) times daily. 12/24/19   Bill Salinas, PA-C    Allergies    Patient has no known allergies.  Review of Systems   Review of Systems  Constitutional: Negative for fever.  Gastrointestinal: Negative for nausea and vomiting.  Skin: Negative for color change.       Positive for abscess  Hematological: Negative for adenopathy.      Physical Exam Updated Vital Signs BP (!) 117/99 (BP Location: Right Arm)   Pulse 103   Temp 98.1 F (36.7 C) (Oral)   Resp 18   Ht 5\' 10"  (1.778 m)   Wt 90.7 kg   SpO2 98%   BMI 28.70 kg/m   Physical Exam Vitals and nursing note reviewed.  Constitutional:      Appearance: He is well-developed.  HENT:     Head: Normocephalic and atraumatic.  Eyes:     Conjunctiva/sclera: Conjunctivae normal.  Pulmonary:     Effort: No respiratory distress.  Genitourinary:    Comments: Patient with 2 to 3 cm area of induration of the perineal area.  Does not appear to track towards the rectum or the scrotum.  Area is very tender to palpation.  Overlying redness only without significant cellulitis surrounding. Musculoskeletal:     Cervical back: Normal range of motion and neck supple.  Skin:    General: Skin is warm and dry.  Neurological:     Mental Status: He is alert.     ED Results / Procedures / Treatments   Labs (all labs ordered are listed, but only abnormal results are displayed) Labs Reviewed - No data to display  EKG None  Radiology No results found.  Procedures .Marland KitchenIncision and Drainage  Date/Time: 07/11/2020 3:49 PM Performed by: Renne Crigler, PA-C Authorized by: Renne Crigler, PA-C   Consent:    Consent obtained:  Verbal   Consent given by:  Patient   Risks discussed:  Bleeding and pain   Alternatives discussed:  No treatment and alternative treatment Location:    Type:  Abscess   Size:  3cm   Location:  Anogenital   Anogenital location:  Perineum Pre-procedure details:    Skin preparation:  Betadine Anesthesia (see MAR for exact dosages):    Anesthesia method:  Local infiltration   Local anesthetic:  Lidocaine 1% WITH epi Procedure type:    Complexity:  Simple Procedure details:    Incision types:  Stab incision   Incision depth:  Subcutaneous   Scalpel blade:  11   Wound management:  Probed and deloculated   Drainage:  Purulent   Drainage  amount:  Moderate   Wound treatment:  Wound left open   Packing materials:  None Post-procedure details:    Patient tolerance of procedure:  Tolerated well, no immediate complications   (including critical care time)  Medications Ordered in ED Medications  lidocaine-EPINEPHrine (XYLOCAINE W/EPI) 2 %-1:200000 (PF) injection 10 mL (has no administration in time range)  sulfamethoxazole-trimethoprim (BACTRIM DS) 800-160 MG per tablet 1 tablet (has no administration in time range)    ED Course  I have reviewed the triage vital signs and the nursing notes.  Pertinent labs & imaging results that were available during my care of the patient were reviewed by me and considered in my medical decision making (see chart for details).  Patient seen and examined.  There is an area that appears to be amenable to I&D.  It is not on the scrotum or around the anus.  Discussed I&D procedure with patient.  He has had this done in the past.  He agrees to proceed.  Will start on antibiotics.  Vital signs reviewed and are as follows: BP (!) 117/99 (BP Location: Right Arm)   Pulse 103   Temp 98.1 F (36.7 C) (Oral)   Resp 18   Ht 5\' 10"  (1.778 m)   Wt 90.7 kg   SpO2 98%   BMI 28.70 kg/m   3:49 PM The patient was urged to return to the Emergency Department urgently with worsening pain, swelling, expanding erythema especially if it streaks away from the affected area, fever, or if they have any other concerns.   The patient was urged to return to the Emergency Department or go to their PCP in 48 hours for wound recheck if the area is not significantly improved.  RX: Bactrim, bactroban ointment  The patient verbalized understanding and stated agreement with this plan.     MDM Rules/Calculators/A&P                          Patient with perineal abscess, drained without complication.  Does not appear to be perianal perirectal abscess.  Does not involve scrotum.  Abscess drained with good results.   Prescription for Bactrim given location and diabetes history.    Final Clinical Impression(s) / ED Diagnoses Final diagnoses:  Abscess    Rx / DC Orders ED Discharge Orders         Ordered    sulfamethoxazole-trimethoprim (BACTRIM DS) 800-160 MG tablet  2 times daily     Discontinue  Reprint     07/11/20 1547    mupirocin nasal ointment (  BACTROBAN) 2 %     Discontinue  Reprint     07/11/20 1547           Renne Crigler, PA-C 07/11/20 1551    Arby Barrette, MD 07/17/20 1156

## 2020-08-04 DIAGNOSIS — M19011 Primary osteoarthritis, right shoulder: Secondary | ICD-10-CM | POA: Insufficient documentation

## 2020-09-24 ENCOUNTER — Other Ambulatory Visit: Payer: Self-pay

## 2020-09-24 ENCOUNTER — Encounter (HOSPITAL_BASED_OUTPATIENT_CLINIC_OR_DEPARTMENT_OTHER): Payer: Self-pay | Admitting: Emergency Medicine

## 2020-09-24 ENCOUNTER — Emergency Department (HOSPITAL_BASED_OUTPATIENT_CLINIC_OR_DEPARTMENT_OTHER)
Admission: EM | Admit: 2020-09-24 | Discharge: 2020-09-24 | Disposition: A | Discharge status: Home / Self Care | Payer: Medicaid Other | Attending: Emergency Medicine | Admitting: Emergency Medicine

## 2020-09-24 DIAGNOSIS — E119 Type 2 diabetes mellitus without complications: Secondary | ICD-10-CM | POA: Insufficient documentation

## 2020-09-24 DIAGNOSIS — L539 Erythematous condition, unspecified: Secondary | ICD-10-CM | POA: Diagnosis present

## 2020-09-24 DIAGNOSIS — L03115 Cellulitis of right lower limb: Secondary | ICD-10-CM | POA: Insufficient documentation

## 2020-09-24 DIAGNOSIS — F17200 Nicotine dependence, unspecified, uncomplicated: Secondary | ICD-10-CM | POA: Insufficient documentation

## 2020-09-24 DIAGNOSIS — Z7984 Long term (current) use of oral hypoglycemic drugs: Secondary | ICD-10-CM | POA: Diagnosis not present

## 2020-09-24 DIAGNOSIS — L0390 Cellulitis, unspecified: Secondary | ICD-10-CM

## 2020-09-24 MED ORDER — DOXYCYCLINE HYCLATE 100 MG PO CAPS: 100.0000 mg | ORAL_CAPSULE | Freq: Two times a day (BID) | ORAL | 0 refills | Status: AC

## 2020-09-24 NOTE — ED Triage Notes (Signed)
Pt arroves pov with c/o insect bite to distal RLE x 3 days. Pt endorses 25 mg benadryl 2 hrs pta. Redness and swelling noted

## 2020-09-24 NOTE — Discharge Instructions (Addendum)
Please follow-up with your primary doctor for recheck early next week.  Take doxycycline as prescribed.  Use warm compresses as instructed.  If you develop worsening redness, fever, return to ER for reassessment

## 2020-09-25 NOTE — ED Provider Notes (Signed)
[facility] EMERGENCY DEPARTMENT Provider Note   CSN: [id number] Arrival date & time: 09/24/20  1713     History Chief Complaint  Patient presents with  . Insect Bite    Tony Patel. is a 54 y.o. male.  Presents to ER with concern for insect bite.  Reports that over the last 3 days he has noted redness, itching and pain to likely insect bite on his right lower leg.  Tried taking Benadryl without relief.  No fevers.  States he had a similar episode on his buttocks but this resolved.  History of diabetes, prior abscesses.  HPI     Past Medical History:  Diagnosis Date  . Abscess   . Back pain   . Diabetes mellitus without complication (HCC)   . Sciatica     There are no problems to display for this patient.   Past Surgical History:  Procedure Laterality Date  . TONSILLECTOMY         History reviewed. No pertinent family history.  Social History   Tobacco Use  . Smoking status: Current Every Day Smoker    Packs/day: 0.50  . Smokeless tobacco: Never Used  Vaping Use  . Vaping Use: Never used  Substance Use Topics  . Alcohol use: Yes    Comment: occ  . Drug use: No    Home Medications Prior to Admission medications   Medication Sig Start Date End Date Taking? Authorizing Provider  doxycycline (VIBRAMYCIN) 100 MG capsule Take 1 capsule (100 mg total) by mouth 2 (two) times daily for 7 days. 09/24/20 10/01/20  Milagros Loll, MD  ibuprofen (ADVIL) 800 MG tablet Take 1 tablet (800 mg total) by mouth 2 (two) times daily. 12/24/19   [name] A, PA-C  JANUVIA 50 MG tablet Take 50 mg by mouth daily. 12/07/19   [provider]  METFORMIN HCL PO Take 1,000 mg by mouth 2 (two) times daily.     [provider]  mupirocin nasal ointment (BACTROBAN) 2 % Apply in each nostril using Qtip twice a day for 5 days 07/11/20   Renne Crigler, PA-C    Allergies    Patient has no known allergies.  Review of Systems   Review of Systems    Constitutional: Negative for chills and fever.  HENT: Negative for ear pain and sore throat.   Eyes: Negative for pain and visual disturbance.  Respiratory: Negative for cough and shortness of breath.   Cardiovascular: Negative for chest pain and palpitations.  Gastrointestinal: Negative for abdominal pain and vomiting.  Genitourinary: Negative for dysuria and hematuria.  Musculoskeletal: Negative for arthralgias and back pain.  Skin: Positive for color change, rash and wound.  Neurological: Negative for seizures and syncope.  All other systems reviewed and are negative.   Physical Exam Updated Vital Signs BP (!) 144/94 (BP Location: Right Arm)   Pulse 89   Temp 98.4 F (36.9 C) (Oral)   Resp 17   Ht 5\' 10"  (1.778 m)   Wt 88.5 kg   SpO2 99%   BMI 27.98 kg/m   Physical Exam Vitals and nursing note reviewed.  Constitutional:      Appearance: He is well-developed.  HENT:     Head: Normocephalic and atraumatic.  Eyes:     Conjunctiva/sclera: Conjunctivae normal.  Cardiovascular:     Rate and Rhythm: Normal rate and regular rhythm.     Heart sounds: No murmur heard.   Pulmonary:     Effort: Pulmonary  effort is normal. No respiratory distress.     Breath sounds: Normal breath sounds.  Abdominal:     Palpations: Abdomen is soft.     Tenderness: There is no abdominal tenderness.  Musculoskeletal:        General: No deformity or signs of injury.     Cervical back: Neck supple.  Skin:    General: Skin is warm and dry.     Comments: 1 cm diameter area of erythema and slight induration over the distal anterior lower leg, no fluctuance appreciated, blanchable  Neurological:     Mental Status: He is alert.     ED Results / Procedures / Treatments   Labs (all labs ordered are listed, but only abnormal results are displayed) Labs Reviewed - No data to display  EKG None  Radiology No results found.  Procedures Procedures (including critical care  time)  Medications Ordered in ED Medications - No data to display  ED Course  I have reviewed the triage vital signs and the nursing notes.  Pertinent labs & imaging results that were available during my care of the patient were reviewed by me and considered in my medical decision making (see chart for details).    MDM Rules/Calculators/A&P                         54 year old male presents to ER with concern for possible bug bite, skin infection.  On exam he has a very small area of erythema, suspect mild cellulitis versus early abscess.  No significant area of fluctuance at this time, no discrete target for I&D attempt.  For now, will recommend course of antibiotics and warm compresses.  Recommend that he follow-up with primary doctor for wound recheck or return here for wound recheck in 2-3 days.   After the discussed management above, the patient was determined to be safe for discharge.  The patient was in agreement with this plan and all questions regarding their care were answered.  ED return precautions were discussed and the patient will return to the ED with any significant worsening of condition.  Final Clinical Impression(s) / ED Diagnoses Final diagnoses:  Cellulitis, unspecified cellulitis site    Rx / DC Orders ED Discharge Orders         Ordered    doxycycline (VIBRAMYCIN) 100 MG capsule  2 times daily        09/24/20 1927           Milagros Loll, MD 09/25/20 1455

## 2020-09-28 ENCOUNTER — Encounter (HOSPITAL_BASED_OUTPATIENT_CLINIC_OR_DEPARTMENT_OTHER): Payer: Self-pay

## 2020-09-28 ENCOUNTER — Emergency Department (HOSPITAL_BASED_OUTPATIENT_CLINIC_OR_DEPARTMENT_OTHER)
Admission: EM | Admit: 2020-09-28 | Discharge: 2020-09-28 | Disposition: A | Discharge status: Home / Self Care | Payer: Medicaid Other | Attending: Emergency Medicine | Admitting: Emergency Medicine

## 2020-09-28 ENCOUNTER — Other Ambulatory Visit: Payer: Self-pay

## 2020-09-28 DIAGNOSIS — L02415 Cutaneous abscess of right lower limb: Secondary | ICD-10-CM | POA: Insufficient documentation

## 2020-09-28 DIAGNOSIS — Z7984 Long term (current) use of oral hypoglycemic drugs: Secondary | ICD-10-CM | POA: Diagnosis not present

## 2020-09-28 DIAGNOSIS — E119 Type 2 diabetes mellitus without complications: Secondary | ICD-10-CM | POA: Insufficient documentation

## 2020-09-28 DIAGNOSIS — F17200 Nicotine dependence, unspecified, uncomplicated: Secondary | ICD-10-CM | POA: Diagnosis not present

## 2020-09-28 DIAGNOSIS — L0291 Cutaneous abscess, unspecified: Secondary | ICD-10-CM

## 2020-09-28 DIAGNOSIS — L539 Erythematous condition, unspecified: Secondary | ICD-10-CM | POA: Diagnosis present

## 2020-09-28 MED ORDER — LIDOCAINE-EPINEPHRINE (PF) 2 %-1:200000 IJ SOLN
INTRAMUSCULAR | Status: AC
  Administered 2020-09-28: 10 mL
  Filled 2020-09-28: qty 20

## 2020-09-28 NOTE — Discharge Instructions (Signed)
Continue taking your antibiotics.  Please follow up with your primary care provider within 5-7 days for re-evaluation of your symptoms. If you do not have a primary care provider, information for a healthcare clinic has been provided for you to make arrangements for follow up care.   Please return to the emergency room immediately if you experience any new or worsening symptoms or any symptoms that indicate worsening infection such as fevers, increased redness/swelling/pain, warmth, or drainage from the affected area.

## 2020-09-28 NOTE — ED Triage Notes (Signed)
Pt c/o cellulitis to right LE-states he was seen here 10/9 and feels area is worse-NAD-steady gait

## 2020-09-28 NOTE — ED Provider Notes (Signed)
[facility] EMERGENCY DEPARTMENT Provider Note   CSN: [id number] Arrival date & time: 09/28/20  2008     History Chief Complaint  Patient presents with  . Cellulitis    Tony Borunda. is a 54 y.o. male.  HPI    54 year old male with a history of abscess, back pain, diabetes, sciatica, presents emergency department today for evaluation of skin infection.  He was seen several days ago in the emergency department for redness noted to the right lower leg.  He was started on doxycycline at that time.  He states he did not actually fill the medication until 2 days ago CDs only had about 2 days of the antibiotic.  He noticed that the swelling has increased somewhat and there is a whitehead to the center of the redness.  He has had no fevers or systemic symptoms.  Past Medical History:  Diagnosis Date  . Abscess   . Back pain   . Diabetes mellitus without complication (HCC)   . Sciatica     There are no problems to display for this patient.   Past Surgical History:  Procedure Laterality Date  . TONSILLECTOMY         No family history on file.  Social History   Tobacco Use  . Smoking status: Current Every Day Smoker    Packs/day: 0.50  . Smokeless tobacco: Never Used  Vaping Use  . Vaping Use: Never used  Substance Use Topics  . Alcohol use: Yes    Comment: occ  . Drug use: No    Home Medications Prior to Admission medications   Medication Sig Start Date End Date Taking? Authorizing Provider  doxycycline (VIBRAMYCIN) 100 MG capsule Take 1 capsule (100 mg total) by mouth 2 (two) times daily for 7 days. 09/24/20 10/01/20  Milagros Loll, MD  HYDROcodone-acetaminophen (NORCO) 10-325 MG tablet Take 1 tablet by mouth every 6 (six) hours. 09/16/20   [provider]  ibuprofen (ADVIL) 800 MG tablet Take 1 tablet (800 mg total) by mouth 2 (two) times daily. 12/24/19   [name] A, PA-C  JANUVIA 50 MG tablet Take 50 mg by mouth daily. 12/07/19    [provider]  meloxicam (MOBIC) 7.5 MG tablet Take 7.5 mg by mouth daily. 08/23/20   [provider]  METFORMIN HCL PO Take 1,000 mg by mouth 2 (two) times daily.     [provider]  mupirocin nasal ointment (BACTROBAN) 2 % Apply in each nostril using Qtip twice a day for 5 days 07/11/20   Renne Crigler, PA-C  rosuvastatin (CRESTOR) 20 MG tablet Take 20 mg by mouth daily. 09/27/20   [provider]    Allergies    Patient has no known allergies.  Review of Systems   Review of Systems  Musculoskeletal:       Right ankle pain  Skin: Positive for color change and wound.    Physical Exam Updated Vital Signs BP (!) 153/74 (BP Location: Right Arm)   Pulse 88   Temp 98.6 F (37 C) (Oral)   Resp 17   Ht 5\' 10"  (1.778 m)   Wt 88.5 kg   SpO2 100%   BMI 27.98 kg/m   Physical Exam Constitutional:      General: He is not in acute distress.    Appearance: He is well-developed.  Eyes:     Conjunctiva/sclera: Conjunctivae normal.  Cardiovascular:     Rate and Rhythm: Normal rate.  Pulmonary:  Effort: Pulmonary effort is normal.  Musculoskeletal:     Comments: 3x3 cm area of erythema and induration to the right lower extremity with central pustule and central area of fluctuance.  Skin:    General: Skin is warm and dry.  Neurological:     Mental Status: He is alert and oriented to person, place, and time.     ED Results / Procedures / Treatments   Labs (all labs ordered are listed, but only abnormal results are displayed) Labs Reviewed - No data to display  EKG None  Radiology No results found.  Procedures .Marland KitchenIncision and Drainage  Date/Time: 09/28/2020 10:31 PM Performed by: Karrie Meres, PA-C Authorized by: Karrie Meres, PA-C   Consent:    Consent obtained:  Verbal   Consent given by:  Patient   Risks discussed:  Bleeding, incomplete drainage and pain   Alternatives discussed:  No treatment Location:    Type:   Abscess   Size:  1   Location:  Lower extremity   Lower extremity location:  Ankle   Ankle location:  R ankle Pre-procedure details:    Skin preparation:  Betadine Anesthesia (see MAR for exact dosages):    Anesthesia method:  Local infiltration   Local anesthetic:  Lidocaine 2% WITH epi Procedure type:    Complexity:  Simple Procedure details:    Incision types:  Stab incision   Incision depth:  Dermal   Scalpel blade:  11   Drainage:  Purulent and bloody   Drainage amount:  Scant   Wound treatment:  Wound left open Post-procedure details:    Patient tolerance of procedure:  Tolerated well, no immediate complications   (including critical care time)  Medications Ordered in ED Medications  lidocaine-EPINEPHrine (XYLOCAINE W/EPI) 2 %-1:200000 (PF) injection (10 mLs Infiltration Given by Other 09/28/20 2237)    ED Course  I have reviewed the triage vital signs and the nursing notes.  Pertinent labs & imaging results that were available during my care of the patient were reviewed by me and considered in my medical decision making (see chart for details).    MDM Rules/Calculators/A&P                          Patient with skin abscess amenable to incision and drainage.  Abscess was not large enough to warrant packing or drain. Encouraged home warm soaks and flushing.  Mild signs of cellulitis is surrounding skin.  Will d/c to home.  Advised him to continue abx at home and f/u with pcp. Return if worse.     Final Clinical Impression(s) / ED Diagnoses Final diagnoses:  Abscess    Rx / DC Orders ED Discharge Orders    None       Tony Patel 09/28/20 2301    [name], MD 10/01/20 1851

## 2020-12-17 ENCOUNTER — Other Ambulatory Visit: Payer: Self-pay

## 2020-12-17 ENCOUNTER — Emergency Department (HOSPITAL_BASED_OUTPATIENT_CLINIC_OR_DEPARTMENT_OTHER)
Admission: EM | Admit: 2020-12-17 | Discharge: 2020-12-17 | Discharge status: Against Medical Advice | Payer: Medicaid Other

## 2020-12-22 ENCOUNTER — Other Ambulatory Visit: Payer: Self-pay

## 2020-12-22 ENCOUNTER — Encounter (HOSPITAL_BASED_OUTPATIENT_CLINIC_OR_DEPARTMENT_OTHER): Payer: Self-pay

## 2020-12-22 ENCOUNTER — Emergency Department (HOSPITAL_BASED_OUTPATIENT_CLINIC_OR_DEPARTMENT_OTHER)
Admission: EM | Admit: 2020-12-22 | Discharge: 2020-12-22 | Disposition: A | Discharge status: Home / Self Care | Payer: Medicaid Other | Attending: Emergency Medicine | Admitting: Emergency Medicine

## 2020-12-22 DIAGNOSIS — F17200 Nicotine dependence, unspecified, uncomplicated: Secondary | ICD-10-CM | POA: Diagnosis not present

## 2020-12-22 DIAGNOSIS — Z7984 Long term (current) use of oral hypoglycemic drugs: Secondary | ICD-10-CM | POA: Diagnosis not present

## 2020-12-22 DIAGNOSIS — M25551 Pain in right hip: Secondary | ICD-10-CM | POA: Diagnosis present

## 2020-12-22 DIAGNOSIS — L739 Follicular disorder, unspecified: Secondary | ICD-10-CM

## 2020-12-22 DIAGNOSIS — L662 Folliculitis decalvans: Secondary | ICD-10-CM | POA: Insufficient documentation

## 2020-12-22 DIAGNOSIS — E119 Type 2 diabetes mellitus without complications: Secondary | ICD-10-CM | POA: Diagnosis not present

## 2020-12-22 MED ORDER — MUPIROCIN CALCIUM 2 % NA OINT: TOPICAL_OINTMENT | NASAL | 0 refills | Status: AC

## 2020-12-22 MED ORDER — DOXYCYCLINE HYCLATE 100 MG PO CAPS: 100.0000 mg | ORAL_CAPSULE | Freq: Two times a day (BID) | ORAL | 0 refills | Status: DC

## 2020-12-22 NOTE — ED Triage Notes (Signed)
Pt arrives with c/o boils on hips, worse on right hip, pt states he has had them before, sometimes needing to be drained.

## 2020-12-22 NOTE — ED Provider Notes (Signed)
[facility] EMERGENCY DEPARTMENT Provider Note  CSN: [id number] Arrival date & time: 12/22/20 7741    History Chief Complaint  Patient presents with  . Abscess    HPI  Tony Daft. is a 55 y.o. male reports he has had several boils on his bilateral hips/buttocks for the last several days. He states he tried to tough it out over the holiday but has had to have boils laced in the past. Denies any fever.    Past Medical History:  Diagnosis Date  . Abscess   . Back pain   . Diabetes mellitus without complication (HCC)   . Sciatica     Past Surgical History:  Procedure Laterality Date  . TONSILLECTOMY      No family history on file.  Social History   Tobacco Use  . Smoking status: Current Every Day Smoker    Packs/day: 0.50  . Smokeless tobacco: Never Used  Vaping Use  . Vaping Use: Never used  Substance Use Topics  . Alcohol use: Yes    Comment: occ  . Drug use: No     Home Medications Prior to Admission medications   Medication Sig Start Date End Date Taking? Authorizing Provider  doxycycline (VIBRAMYCIN) 100 MG capsule Take 1 capsule (100 mg total) by mouth 2 (two) times daily. 12/22/20  Yes [name], MD  HYDROcodone-acetaminophen [facility]) 10-325 MG tablet Take 1 tablet by mouth every 6 (six) hours. 09/16/20   [provider]  ibuprofen (ADVIL) 800 MG tablet Take 1 tablet (800 mg total) by mouth 2 (two) times daily. 12/24/19   [name] A, PA-C  JANUVIA 50 MG tablet Take 50 mg by mouth daily. 12/07/19   [provider]  meloxicam (MOBIC) 7.5 MG tablet Take 7.5 mg by mouth daily. 08/23/20   [provider]  METFORMIN HCL PO Take 1,000 mg by mouth 2 (two) times daily.     [provider]  mupirocin nasal ointment (BACTROBAN) 2 % Apply in each nostril using Qtip twice a day for 5 days 12/22/20   [name], MD  rosuvastatin (CRESTOR) 20 MG tablet Take 20 mg by mouth daily. 09/27/20   [provider]     Allergies    Patient has no known allergies.   Review of Systems   Review of Systems A comprehensive review of systems was completed and negative except as noted in HPI.    Physical Exam BP (!) 131/100 (BP Location: Right Arm)   Pulse 96   Temp 98.1 F (36.7 C) (Oral)   Resp 18   Ht 5\' 10"  (1.778 m)   Wt 88.5 kg   SpO2 99%   BMI 27.98 kg/m   Physical Exam Vitals and nursing note reviewed.  HENT:     Head: Normocephalic.     Nose: Nose normal.  Eyes:     Extraocular Movements: Extraocular movements intact.  Pulmonary:     Effort: Pulmonary effort is normal.  Musculoskeletal:        General: Normal range of motion.     Cervical back: Neck supple.     Comments: Patient has several areas of healing folliculitis on both buttocks, no fluctuance. Mild-moderate surrounding erythema.   Skin:    Findings: No rash (on exposed skin).  Neurological:     Mental Status: He is alert and oriented to person, place, and time.  Psychiatric:        Mood and Affect: Mood normal.  ED Results / Procedures / Treatments   Labs (all labs ordered are listed, but only abnormal results are displayed) Labs Reviewed - No data to display  EKG None   Radiology No results found.  Procedures Procedures  Medications Ordered in the ED Medications - No data to display   MDM Rules/Calculators/A&P MDM Patient with folliculitis, no indication for drainage. Plan discharge with Rx for doxycycline. PCP Follow up.  ED Course  I have reviewed the triage vital signs and the nursing notes.  Pertinent labs & imaging results that were available during my care of the patient were reviewed by me and considered in my medical decision making (see chart for details).     Final Clinical Impression(s) / ED Diagnoses Final diagnoses:  Folliculitis    Rx / DC Orders ED Discharge Orders         Ordered    doxycycline (VIBRAMYCIN) 100 MG capsule  2 times daily         12/22/20 0741    mupirocin nasal ointment (BACTROBAN) 2 %        12/22/20 0741           [name], MD 12/22/20 [phone]

## 2020-12-22 NOTE — ED Triage Notes (Signed)
Pt reports taking a hydrocodone PTA

## 2020-12-22 NOTE — ED Notes (Signed)
Pt here with abscess on right and left buttock. Pt states that he has been here several times for the the same reason.

## 2021-02-17 ENCOUNTER — Encounter (HOSPITAL_BASED_OUTPATIENT_CLINIC_OR_DEPARTMENT_OTHER): Payer: Self-pay

## 2021-02-17 ENCOUNTER — Emergency Department (HOSPITAL_BASED_OUTPATIENT_CLINIC_OR_DEPARTMENT_OTHER)
Admission: EM | Admit: 2021-02-17 | Discharge: 2021-02-17 | Disposition: A | Discharge status: Home / Self Care | Payer: Medicaid Other | Attending: Emergency Medicine | Admitting: Emergency Medicine

## 2021-02-17 ENCOUNTER — Other Ambulatory Visit: Payer: Self-pay

## 2021-02-17 ENCOUNTER — Other Ambulatory Visit (HOSPITAL_COMMUNITY): Payer: Self-pay | Admitting: Emergency Medicine

## 2021-02-17 DIAGNOSIS — F17200 Nicotine dependence, unspecified, uncomplicated: Secondary | ICD-10-CM | POA: Diagnosis not present

## 2021-02-17 DIAGNOSIS — L0231 Cutaneous abscess of buttock: Secondary | ICD-10-CM | POA: Diagnosis not present

## 2021-02-17 DIAGNOSIS — R2241 Localized swelling, mass and lump, right lower limb: Secondary | ICD-10-CM | POA: Diagnosis present

## 2021-02-17 DIAGNOSIS — Z7984 Long term (current) use of oral hypoglycemic drugs: Secondary | ICD-10-CM | POA: Diagnosis not present

## 2021-02-17 DIAGNOSIS — E119 Type 2 diabetes mellitus without complications: Secondary | ICD-10-CM | POA: Diagnosis not present

## 2021-02-17 DIAGNOSIS — L0291 Cutaneous abscess, unspecified: Secondary | ICD-10-CM

## 2021-02-17 MED ORDER — DOXYCYCLINE HYCLATE 100 MG PO CAPS: 100.0000 mg | ORAL_CAPSULE | Freq: Two times a day (BID) | ORAL | 0 refills | Status: DC

## 2021-02-17 MED ORDER — LIDOCAINE-EPINEPHRINE (PF) 2 %-1:200000 IJ SOLN
10.0000 mL | Freq: Once | INTRAMUSCULAR | Status: AC
  Administered 2021-02-17: 10 mL
  Filled 2021-02-17: qty 20

## 2021-02-17 MED FILL — DOXYCYCLINE HYCLATE 100 MG: 100 | 10 days supply | Qty: 20 | Fill #0

## 2021-02-17 NOTE — ED Provider Notes (Signed)
[facility] EMERGENCY DEPARTMENT Provider Note   CSN: [id number] Arrival date & time: 02/17/21  8676     History Chief Complaint  Patient presents with  . Abscess    Tony Helbert. is a 55 y.o. male.  The history is provided by the patient.  Abscess Location:  Pelvis Pelvic abscess location:  R buttock Size:  Quarter sized Abscess quality: fluctuance, induration and painful   Red streaking: no   Duration:  4 days Progression:  Worsening Pain details:    Quality:  Sharp and shooting   Severity:  Moderate   Duration:  4 days   Timing:  Constant   Progression:  Worsening Chronicity:  Recurrent Context: not skin injury   Relieved by:  Nothing Worsened by:  Draining/squeezing Ineffective treatments:  Warm compresses and warm water soaks Associated symptoms: no fever   Risk factors: prior abscess        Past Medical History:  Diagnosis Date  . Abscess   . Back pain   . Diabetes mellitus without complication (HCC)   . Sciatica     There are no problems to display for this patient.   Past Surgical History:  Procedure Laterality Date  . TONSILLECTOMY         No family history on file.  Social History   Tobacco Use  . Smoking status: Current Every Day Smoker    Packs/day: 0.50  . Smokeless tobacco: Never Used  Vaping Use  . Vaping Use: Never used  Substance Use Topics  . Alcohol use: Yes    Comment: occ  . Drug use: No    Home Medications Prior to Admission medications   Medication Sig Start Date End Date Taking? Authorizing Provider  doxycycline (VIBRAMYCIN) 100 MG capsule Take 1 capsule (100 mg total) by mouth 2 (two) times daily. 02/17/21  Yes Samel Bruna, Alphonzo Lemmings, MD  HYDROcodone-acetaminophen (NORCO) 10-325 MG tablet Take 1 tablet by mouth every 6 (six) hours. 09/16/20   [provider]  ibuprofen (ADVIL) 800 MG tablet Take 1 tablet (800 mg total) by mouth 2 (two) times daily. 12/24/19   [name] A, PA-C  JANUVIA 50 MG  tablet Take 50 mg by mouth daily. 12/07/19   [provider]  meloxicam (MOBIC) 7.5 MG tablet Take 7.5 mg by mouth daily. 08/23/20   [provider]  METFORMIN HCL PO Take 1,000 mg by mouth 2 (two) times daily.     [provider]  mupirocin nasal ointment (BACTROBAN) 2 % Apply in each nostril using Qtip twice a day for 5 days 12/22/20   [name], MD  rosuvastatin (CRESTOR) 20 MG tablet Take 20 mg by mouth daily. 09/27/20   [provider]    Allergies    Patient has no known allergies.  Review of Systems   Review of Systems  Constitutional: Negative for fever.  All other systems reviewed and are negative.   Physical Exam Updated Vital Signs BP (!) 158/100   Pulse 68   Temp 98.6 F (37 C) (Oral)   Resp 18   Ht 5\' 10"  (1.778 m)   Wt 90.7 kg   SpO2 98%   BMI 28.70 kg/m   Physical Exam Constitutional:      General: He is not in acute distress.    Appearance: Normal appearance. He is normal weight.  Eyes:     Pupils: Pupils are equal, round, and reactive to light.  Cardiovascular:     Rate and Rhythm:  Normal rate.  Pulmonary:     Effort: Pulmonary effort is normal.  Musculoskeletal:        General: No swelling.     Right lower leg: No edema.     Left lower leg: No edema.  Skin:    General: Skin is warm.       Neurological:     Mental Status: He is alert and oriented to person, place, and time. Mental status is at baseline.  Psychiatric:        Mood and Affect: Mood normal.        Behavior: Behavior normal.     ED Results / Procedures / Treatments   Labs (all labs ordered are listed, but only abnormal results are displayed) Labs Reviewed - No data to display  EKG None  Radiology No results found.  Procedures Procedures   INCISION AND DRAINAGE Performed by: [name] Consent: Verbal consent obtained. Risks and benefits: risks, benefits and alternatives were discussed Type: abscess  Body area: right  buttocks  Anesthesia: local infiltration  Incision was made with a scalpel.  Local anesthetic: lidocaine 2% with epinephrine  Anesthetic total: 4 ml  Complexity: complex Blunt dissection to break up loculations  Drainage: purulent  Drainage amount: 37mL  Packing material: none  Patient tolerance: Patient tolerated the procedure well with no immediate complications.    Medications Ordered in ED Medications  lidocaine-EPINEPHrine (XYLOCAINE W/EPI) 2 %-1:200000 (PF) injection 10 mL (has no administration in time range)    ED Course  I have reviewed the triage vital signs and the nursing notes.  Pertinent labs & imaging results that were available during my care of the patient were reviewed by me and considered in my medical decision making (see chart for details).    MDM Rules/Calculators/A&P                          Patient presenting today with an uncomplicated abscess of the right buttocks.  No rectal involvement or evidence of deep infection.  I&D with purulent drainage and no complicating features.  However because of having some small pustules around this area patient was placed on antibiotics.  Instructed to continue doing warm soaks or compresses.  Final Clinical Impression(s) / ED Diagnoses Final diagnoses:  Abscess    Rx / DC Orders ED Discharge Orders         Ordered    doxycycline (VIBRAMYCIN) 100 MG capsule  2 times daily        02/17/21 1039           [name], MD 02/17/21 1049

## 2021-02-17 NOTE — ED Triage Notes (Signed)
Pt arrives with "angry cyst" to his right buttock reports history of same.

## 2021-02-17 NOTE — ED Notes (Signed)
Pt understands to not take pain meds and drive

## 2021-02-19 ENCOUNTER — Other Ambulatory Visit: Payer: Self-pay

## 2021-02-19 ENCOUNTER — Encounter (HOSPITAL_BASED_OUTPATIENT_CLINIC_OR_DEPARTMENT_OTHER): Payer: Self-pay | Admitting: Emergency Medicine

## 2021-02-19 ENCOUNTER — Emergency Department (HOSPITAL_BASED_OUTPATIENT_CLINIC_OR_DEPARTMENT_OTHER)
Admission: EM | Admit: 2021-02-19 | Discharge: 2021-02-19 | Disposition: A | Discharge status: Home / Self Care | Payer: Medicaid Other | Attending: Emergency Medicine | Admitting: Emergency Medicine

## 2021-02-19 DIAGNOSIS — L0291 Cutaneous abscess, unspecified: Secondary | ICD-10-CM

## 2021-02-19 DIAGNOSIS — Z7984 Long term (current) use of oral hypoglycemic drugs: Secondary | ICD-10-CM | POA: Insufficient documentation

## 2021-02-19 DIAGNOSIS — E119 Type 2 diabetes mellitus without complications: Secondary | ICD-10-CM | POA: Insufficient documentation

## 2021-02-19 DIAGNOSIS — R2241 Localized swelling, mass and lump, right lower limb: Secondary | ICD-10-CM | POA: Diagnosis present

## 2021-02-19 DIAGNOSIS — L0231 Cutaneous abscess of buttock: Secondary | ICD-10-CM | POA: Diagnosis not present

## 2021-02-19 DIAGNOSIS — F17200 Nicotine dependence, unspecified, uncomplicated: Secondary | ICD-10-CM | POA: Diagnosis not present

## 2021-02-19 NOTE — ED Triage Notes (Signed)
Pt arrives pov with c/o abscess on right buttock. Pt endorses abscess drained on Friday, swelling has returned.

## 2021-02-19 NOTE — ED Notes (Signed)
Pt discharged to home. Discharge instructions have been discussed with patient and/or family members. Pt verbally acknowledges understanding d/c instructions, and endorses comprehension to checkout at registration before leaving.  °

## 2021-02-19 NOTE — Discharge Instructions (Addendum)
Continue the antibiotics.  Continue warm soaks.  Watch for worsening of symptoms.  You may end up even needing follow-up with a dermatologist since he continued to have these recurrent abscesses.

## 2021-02-21 NOTE — ED Provider Notes (Signed)
Can affect very it takes months [facility] EMERGENCY DEPARTMENT Provider Note   CSN: [id number] Arrival date & time: 02/19/21  0800     History Chief Complaint  Patient presents with  . Abscess    Tony Patel. is a 55 y.o. male.  HPI Patient presents for recheck of abscess on right buttock area.  Had been seen 2 days prior and had incision and drainage done in the ER.  States had been doing better then swelled up a little bit.  States today however there is less swelling than it was.  Still some pain.  States his symptoms he was seen in the ER the other day he had to leave and drive to [place] so he was sitting on it for a while.  No fevers.  No chills.  He is diabetic.  Has had abscesses before and wonders why he keeps having them.    Past Medical History:  Diagnosis Date  . Abscess   . Back pain   . Diabetes mellitus without complication (HCC)   . Sciatica     There are no problems to display for this patient.   Past Surgical History:  Procedure Laterality Date  . ROTATOR CUFF REPAIR Right   . TONSILLECTOMY         History reviewed. No pertinent family history.  Social History   Tobacco Use  . Smoking status: Current Every Day Smoker    Packs/day: 0.50  . Smokeless tobacco: Never Used  Vaping Use  . Vaping Use: Never used  Substance Use Topics  . Alcohol use: Yes    Comment: occ  . Drug use: No    Home Medications Prior to Admission medications   Medication Sig Start Date End Date Taking? Authorizing Provider  rosuvastatin (CRESTOR) 20 MG tablet Take 20 mg by mouth daily. 09/27/20  Yes [provider]  doxycycline (VIBRAMYCIN) 100 MG capsule Take 1 capsule (100 mg total) by mouth 2 (two) times daily. 02/17/21   [name], MD  HYDROcodone-acetaminophen (NORCO) 10-325 MG tablet Take 1 tablet by mouth every 6 (six) hours. 09/16/20   [provider]  ibuprofen (ADVIL) 800 MG tablet Take 1 tablet (800 mg total) by mouth 2  (two) times daily. 12/24/19   [name] A, PA-C  JANUVIA 50 MG tablet Take 50 mg by mouth daily. 12/07/19   [provider]  meloxicam (MOBIC) 7.5 MG tablet Take 7.5 mg by mouth daily. 08/23/20   [provider]  METFORMIN HCL PO Take 1,000 mg by mouth 2 (two) times daily.     [provider]  mupirocin nasal ointment (BACTROBAN) 2 % Apply in each nostril using Qtip twice a day for 5 days 12/22/20   [name], MD    Allergies    Patient has no known allergies.  Review of Systems   Review of Systems  Constitutional: Negative for appetite change and fever.  Musculoskeletal: Negative for back pain.  Skin: Positive for wound.  Neurological: Negative for weakness and numbness.    Physical Exam Updated Vital Signs BP 112/81 (BP Location: Right Arm)   Pulse 86   Temp 98.2 F (36.8 C) (Oral)   Resp 18   Ht 5\' 10"  (1.778 m)   Wt 90.7 kg   SpO2 98%   BMI 28.69 kg/m   Physical Exam Vitals and nursing note reviewed.  HENT:     Head: Normocephalic.  Cardiovascular:     Rate and Rhythm: Normal  rate.  Abdominal:     Tenderness: There is no abdominal tenderness.  Musculoskeletal:     Comments: 2cm tender indurated area right thigh, buttock area. Central area of previous I and D.  Skin:    Capillary Refill: Capillary refill takes less than 2 seconds.  Neurological:     Mental Status: He is alert.     ED Results / Procedures / Treatments   Labs (all labs ordered are listed, but only abnormal results are displayed) Labs Reviewed - No data to display  EKG None  Radiology No results found.  Procedures Procedures   Medications Ordered in ED Medications - No data to display  ED Course  I have reviewed the triage vital signs and the nursing notes.  Pertinent labs & imaging results that were available during my care of the patient were reviewed by me and considered in my medical decision making (see chart for details).    MDM  Rules/Calculators/A&P                          Patient with continued abscess on thigh.  Patient stated had swollen back up but is shrunk down again.  Some induration but no real erythema.  Since patient states that it swollen a single stab incision was done to the site of the previous incision and drainage.  There was some mild bloody drainage without purulent drainage.  Do not think he needs further incision and drainage at this time.  Continue the antibiotics that he is on.  Appears to be healing overall rather well. Final Clinical Impression(s) / ED Diagnoses Final diagnoses:  Abscess    Rx / DC Orders ED Discharge Orders    None       Benjiman Core, MD 02/21/21 [phone]

## 2021-03-13 ENCOUNTER — Encounter (HOSPITAL_BASED_OUTPATIENT_CLINIC_OR_DEPARTMENT_OTHER): Payer: Self-pay

## 2021-03-13 ENCOUNTER — Other Ambulatory Visit: Payer: Self-pay

## 2021-03-13 ENCOUNTER — Emergency Department (HOSPITAL_BASED_OUTPATIENT_CLINIC_OR_DEPARTMENT_OTHER)
Admission: EM | Admit: 2021-03-13 | Discharge: 2021-03-13 | Disposition: A | Discharge status: Home / Self Care | Payer: Medicaid Other | Attending: Emergency Medicine | Admitting: Emergency Medicine

## 2021-03-13 DIAGNOSIS — Z7984 Long term (current) use of oral hypoglycemic drugs: Secondary | ICD-10-CM | POA: Insufficient documentation

## 2021-03-13 DIAGNOSIS — L03317 Cellulitis of buttock: Secondary | ICD-10-CM | POA: Diagnosis not present

## 2021-03-13 DIAGNOSIS — E119 Type 2 diabetes mellitus without complications: Secondary | ICD-10-CM | POA: Diagnosis not present

## 2021-03-13 DIAGNOSIS — F17200 Nicotine dependence, unspecified, uncomplicated: Secondary | ICD-10-CM | POA: Diagnosis not present

## 2021-03-13 DIAGNOSIS — L0231 Cutaneous abscess of buttock: Secondary | ICD-10-CM | POA: Diagnosis present

## 2021-03-13 MED ORDER — DOXYCYCLINE HYCLATE 100 MG PO CAPS: 100.0000 mg | ORAL_CAPSULE | Freq: Two times a day (BID) | ORAL | 0 refills | Status: DC

## 2021-03-13 NOTE — ED Triage Notes (Signed)
Abscess to buttocks x 3 days.

## 2021-03-13 NOTE — ED Provider Notes (Signed)
[facility] EMERGENCY DEPARTMENT Provider Note   CSN: [id number] Arrival date & time: 03/13/21  1037     History Chief Complaint  Patient presents with  . Abscess    Tony Patel. is a 55 y.o. male.  The history is provided by the patient.  Abscess Location:  Pelvis Pelvic abscess location:  R buttock Abscess quality: induration and painful   Red streaking: no   Duration:  2 days Progression:  Unchanged Pain details:    Quality:  Aching   Severity:  Mild   Timing:  Constant   Progression:  Unchanged Chronicity:  Recurrent Context: diabetes   Relieved by:  Warm compresses Exacerbated by: pressure. Ineffective treatments:  Warm compresses Associated symptoms: no fever   Risk factors: prior abscess   Risk factors comment:  Recurrent abscesses in the same area      Past Medical History:  Diagnosis Date  . Abscess   . Back pain   . Diabetes mellitus without complication (HCC)   . Sciatica     There are no problems to display for this patient.   Past Surgical History:  Procedure Laterality Date  . ROTATOR CUFF REPAIR Right   . TONSILLECTOMY         History reviewed. No pertinent family history.  Social History   Tobacco Use  . Smoking status: Current Every Day Smoker    Packs/day: 0.50  . Smokeless tobacco: Never Used  Vaping Use  . Vaping Use: Never used  Substance Use Topics  . Alcohol use: Yes    Comment: occ  . Drug use: No    Home Medications Prior to Admission medications   Medication Sig Start Date End Date Taking? Authorizing Provider  doxycycline (VIBRAMYCIN) 100 MG capsule Take 1 capsule (100 mg total) by mouth 2 (two) times daily. 03/13/21   [name], MD  HYDROcodone-acetaminophen (NORCO) 10-325 MG tablet Take 1 tablet by mouth every 6 (six) hours. 09/16/20   [provider]  ibuprofen (ADVIL) 800 MG tablet Take 1 tablet (800 mg total) by mouth 2 (two) times daily. 12/24/19   [name] A, PA-C   JANUVIA 50 MG tablet Take 50 mg by mouth daily. 12/07/19   [provider]  meloxicam (MOBIC) 7.5 MG tablet Take 7.5 mg by mouth daily. 08/23/20   [provider]  METFORMIN HCL PO Take 1,000 mg by mouth 2 (two) times daily.     [provider]  mupirocin nasal ointment (BACTROBAN) 2 % Apply in each nostril using Qtip twice a day for 5 days 12/22/20   [name], MD  rosuvastatin (CRESTOR) 20 MG tablet Take 20 mg by mouth daily. 09/27/20   [provider]    Allergies    Patient has no known allergies.  Review of Systems   Review of Systems  Constitutional: Negative for fever.  All other systems reviewed and are negative.   Physical Exam Updated Vital Signs BP (!) 133/91   Pulse 60   Temp 97.6 F (36.4 C)   Resp 17   Ht 5\' 10"  (1.778 m)   Wt 88.5 kg   SpO2 100%   BMI 27.98 kg/m   Physical Exam Vitals and nursing note reviewed.  Constitutional:      General: He is not in acute distress.    Appearance: He is well-developed.  HENT:     Head: Normocephalic and atraumatic.  Eyes:     Conjunctiva/sclera: Conjunctivae normal.  Pupils: Pupils are equal, round, and reactive to light.  Cardiovascular:     Rate and Rhythm: Normal rate.  Pulmonary:     Effort: Pulmonary effort is normal. No respiratory distress.  Genitourinary:   Musculoskeletal:        General: No tenderness. Normal range of motion.  Skin:    General: Skin is warm and dry.     Findings: No erythema or rash.  Neurological:     Mental Status: He is alert and oriented to person, place, and time.  Psychiatric:        Behavior: Behavior normal.     ED Results / Procedures / Treatments   Labs (all labs ordered are listed, but only abnormal results are displayed) Labs Reviewed - No data to display  EKG None  Radiology No results found.  Procedures Procedures   Medications Ordered in ED Medications - No data to display  ED Course  I have reviewed  the triage vital signs and the nursing notes.  Pertinent labs & imaging results that were available during my care of the patient were reviewed by me and considered in my medical decision making (see chart for details).    MDM Rules/Calculators/A&P                          Patient presenting today with recurrent irritation of the buttocks area.  He has had multiple abscesses with I&D's in the past.  Most recently at the beginning of this month.  He reports for the last 2 days he has noticed some mild irritation and once concerned another one was coming back.  On exam he has a small area of cellulitis and induration but no fluctuance or pointing.  Low suspicion for anything that is drainable at this time.  Patient started back on doxycycline as he reports this is a medication he tolerates well.  Encouraged frequent warm soaks and exfoliation of the area.  Did recommend following up with his regular doctor as he may need to be seen by ID or dermatology given his recurrent symptoms.  He is diabetic and is working on controlling his blood sugar.  He is otherwise well-appearing today.  No findings to suggest perirectal abscess.  Did caution patient if symptoms worsen he may have to return for I&D.  Final Clinical Impression(s) / ED Diagnoses Final diagnoses:  Cellulitis of buttock    Rx / DC Orders ED Discharge Orders         Ordered    doxycycline (VIBRAMYCIN) 100 MG capsule  2 times daily        03/13/21 1309           [name], MD 03/13/21 1327

## 2021-03-13 NOTE — Discharge Instructions (Signed)
Start antibiotic and continue to soak in a hot bathtub or use a warm compress

## 2021-05-01 ENCOUNTER — Other Ambulatory Visit (HOSPITAL_BASED_OUTPATIENT_CLINIC_OR_DEPARTMENT_OTHER): Payer: Self-pay

## 2021-05-01 ENCOUNTER — Emergency Department (HOSPITAL_BASED_OUTPATIENT_CLINIC_OR_DEPARTMENT_OTHER)
Admission: EM | Admit: 2021-05-01 | Discharge: 2021-05-01 | Disposition: A | Discharge status: Home / Self Care | Payer: Medicaid Other | Attending: Emergency Medicine | Admitting: Emergency Medicine

## 2021-05-01 ENCOUNTER — Other Ambulatory Visit: Payer: Self-pay

## 2021-05-01 ENCOUNTER — Encounter (HOSPITAL_BASED_OUTPATIENT_CLINIC_OR_DEPARTMENT_OTHER): Payer: Self-pay | Admitting: Emergency Medicine

## 2021-05-01 DIAGNOSIS — F17200 Nicotine dependence, unspecified, uncomplicated: Secondary | ICD-10-CM | POA: Insufficient documentation

## 2021-05-01 DIAGNOSIS — L0291 Cutaneous abscess, unspecified: Secondary | ICD-10-CM

## 2021-05-01 DIAGNOSIS — L02214 Cutaneous abscess of groin: Secondary | ICD-10-CM | POA: Diagnosis not present

## 2021-05-01 DIAGNOSIS — E119 Type 2 diabetes mellitus without complications: Secondary | ICD-10-CM | POA: Insufficient documentation

## 2021-05-01 DIAGNOSIS — Z7984 Long term (current) use of oral hypoglycemic drugs: Secondary | ICD-10-CM | POA: Diagnosis not present

## 2021-05-01 MED ORDER — CEPHALEXIN 500 MG PO CAPS
500.0000 mg | ORAL_CAPSULE | Freq: Four times a day (QID) | ORAL | 0 refills | Status: DC
  Filled 2021-05-01: qty 20, 5d supply, fill #0

## 2021-05-01 MED ORDER — DOXYCYCLINE HYCLATE 100 MG PO TABS
100.0000 mg | ORAL_TABLET | Freq: Two times a day (BID) | ORAL | 0 refills | Status: DC
  Filled 2021-05-01: qty 14, 7d supply, fill #0

## 2021-05-01 MED ORDER — LIDOCAINE-EPINEPHRINE (PF) 2 %-1:200000 IJ SOLN
INTRAMUSCULAR | Status: AC
  Administered 2021-05-01: 20 mL
  Filled 2021-05-01: qty 20

## 2021-05-01 MED ORDER — LIDOCAINE-EPINEPHRINE 2 %-1:100000 IJ SOLN: 20.0000 mL | Freq: Once | INTRAMUSCULAR | Status: DC

## 2021-05-01 MED ORDER — DOXYCYCLINE HYCLATE 100 MG PO CAPS: 100.0000 mg | ORAL_CAPSULE | Freq: Two times a day (BID) | ORAL | 0 refills | Status: DC

## 2021-05-01 NOTE — ED Provider Notes (Signed)
[facility] EMERGENCY DEPARTMENT Provider Note   CSN: [id number] Arrival date & time: 05/01/21  8546     History Chief Complaint  Patient presents with  . Recurrent Skin Infections    Tony Patel. is a 55 y.o. male.  HPI 55 year old with history of recurrent abscesses presents to the ER for evaluation of abscess to his left groin and left hip.  Patient reports these presented 2 days ago.  Has not had any drainage from the area.  Reports pain to the area.  No fevers or chills.  No nausea or vomiting.    Past Medical History:  Diagnosis Date  . Abscess   . Back pain   . Diabetes mellitus without complication (HCC)   . Sciatica     There are no problems to display for this patient.   Past Surgical History:  Procedure Laterality Date  . ROTATOR CUFF REPAIR Right   . TONSILLECTOMY         No family history on file.  Social History   Tobacco Use  . Smoking status: Current Every Day Smoker    Packs/day: 0.50  . Smokeless tobacco: Never Used  Vaping Use  . Vaping Use: Never used  Substance Use Topics  . Alcohol use: Yes    Comment: occ  . Drug use: No    Home Medications Prior to Admission medications   Medication Sig Start Date End Date Taking? Authorizing Provider  doxycycline (VIBRAMYCIN) 100 MG capsule Take 1 capsule (100 mg total) by mouth 2 (two) times daily. 03/13/21   [name], MD  doxycycline (VIBRAMYCIN) 100 MG capsule TAKE 1 CAPSULE (100 MG TOTAL) BY MOUTH 2 (TWO) TIMES DAILY. 02/17/21 02/17/22  [name], MD  HYDROcodone-acetaminophen (NORCO) 10-325 MG tablet Take 1 tablet by mouth every 6 (six) hours. 09/16/20   [provider]  ibuprofen (ADVIL) 800 MG tablet Take 1 tablet (800 mg total) by mouth 2 (two) times daily. 12/24/19   [name] A, PA-C  JANUVIA 50 MG tablet Take 50 mg by mouth daily. 12/07/19   [provider]  meloxicam (MOBIC) 7.5 MG tablet Take 7.5 mg by mouth daily. 08/23/20   [provider]  METFORMIN HCL PO Take 1,000 mg by mouth 2 (two) times daily.     [provider]  mupirocin nasal ointment (BACTROBAN) 2 % Apply in each nostril using Qtip twice a day for 5 days 12/22/20   [name], MD  rosuvastatin (CRESTOR) 20 MG tablet Take 20 mg by mouth daily. 09/27/20   [provider]    Allergies    Patient has no known allergies.  Review of Systems   Review of Systems  Constitutional: Negative for chills and fever.  HENT: Negative for congestion.   Eyes: Negative for discharge.  Gastrointestinal: Negative for vomiting.  Musculoskeletal: Positive for myalgias.  Skin: Positive for color change.  Neurological: Negative for weakness.  Psychiatric/Behavioral: Negative for confusion.    Physical Exam Updated Vital Signs BP (!) 130/93 (BP Location: Right Arm)   Pulse 81   Temp 98 F (36.7 C) (Oral)   Resp 18   Ht 5\' 10"  (1.778 m)   Wt 90.7 kg   SpO2 100%   BMI 28.70 kg/m   Physical Exam Vitals and nursing note reviewed.  Constitutional:      General: He is not in acute distress.    Appearance: He is well-developed. He is not ill-appearing or toxic-appearing.  HENT:  Head: Normocephalic and atraumatic.  Eyes:     General: No scleral icterus.       Right eye: No discharge.        Left eye: No discharge.  Pulmonary:     Effort: No respiratory distress.  Musculoskeletal:        General: Normal range of motion.     Cervical back: Normal range of motion.  Skin:    Coloration: Skin is not pale.     Comments: Patient with two 2 x 3 cm abscesses to the left groin and left hip.  Tender to palpation with erythema and warmth.  No drainage.  Minimal fluctuance appreciated  Neurological:     Mental Status: He is alert.  Psychiatric:        Behavior: Behavior normal.        Thought Content: Thought content normal.        Judgment: Judgment normal.     ED Results / Procedures / Treatments   Labs (all labs ordered are  listed, but only abnormal results are displayed) Labs Reviewed - No data to display  EKG None  Radiology No results found.  Procedures .Marland KitchenIncision and Drainage  Date/Time: 05/01/2021 12:11 PM Performed by: Rise Mu, PA-C Authorized by: Rise Mu, PA-C   Consent:    Consent obtained:  Verbal   Consent given by:  Patient   Risks discussed:  Bleeding, damage to other organs, infection, incomplete drainage and pain Location:    Type:  Abscess   Location:  Lower extremity   Lower extremity location:  Leg   Leg location:  L upper leg Pre-procedure details:    Skin preparation:  Povidone-iodine Sedation:    Sedation type:  None Procedure type:    Complexity:  Simple Procedure details:    Incision types:  Stab incision   Incision depth:  Dermal   Drainage:  Bloody and purulent   Drainage amount:  Scant   Wound treatment:  Wound left open Post-procedure details:    Procedure completion:  Tolerated     Medications Ordered in ED Medications  lidocaine-EPINEPHrine (XYLOCAINE W/EPI) 2 %-1:100000 (with pres) injection 20 mL (has no administration in time range)    ED Course  I have reviewed the triage vital signs and the nursing notes.  Pertinent labs & imaging results that were available during my care of the patient were reviewed by me and considered in my medical decision making (see chart for details).    MDM Rules/Calculators/A&P                          Patient with skin abscess amenable to incision and drainage.  Abscess was not large enough to warrant packing or drain,  wound recheck in 2 days. Encouraged home warm soaks and flushing.  Mild signs of cellulitis is surrounding skin.  Will d/c to home.  Given poorly controlled diabetes will start on abx.   Final Clinical Impression(s) / ED Diagnoses Final diagnoses:  Abscess    Rx / DC Orders ED Discharge Orders         Ordered    doxycycline (VIBRAMYCIN) 100 MG capsule  2 times daily         05/01/21 1211    cephALEXin (KEFLEX) 500 MG capsule  4 times daily        05/01/21 1211           Rise Mu, PA-C 05/01/21 1213  Terrilee Files, MD 05/01/21 [phone]

## 2021-05-01 NOTE — Discharge Instructions (Signed)
You have been treated for an abscess in the ED.  ° °There are sign of surrounding infection. Please take all of your antibiotics until finished!   You may develop abdominal discomfort or diarrhea from the antibiotic. You may help offset this with probiotics which you can buy or get in yogurt.  ° °May take tylenol and motrin as needed for pain. Warm compress to the area to help with the healing process. Avoid swimming for 1 week. May soak in warm water to help with pain and the healing process.  ° °Follow up with your doctor, an urgent care, or return to ED in order to remove your packing in 48-72 hours. If you do not have packing return in 48-72 hours for wound recheck. Return to the emergency department if you develop a fever, your abscess appears to become more infected (growing surrounding redness and warmth), new or worsening symptoms develop, any additional concerns.  ° °Abscess °An abscess (boil or furuncle) is an infected area that contains a collection of pus.  ° °SYMPTOMS °Signs and symptoms of an abscess include pain, tenderness, redness, or hardness. You may feel a moveable soft area under your skin. An abscess can occur anywhere in the body.  ° °TREATMENT  °A surgical cut (incision) may be made over your abscess to drain the pus. Gauze may be packed into the space or a drain may be looped through the abscess cavity (pocket). This provides a drain that will allow the cavity to heal from the inside outwards. The abscess may be painful for a few days, but should feel much better if it was drained.  °Your abscess, if seen early, may not have localized and may not have been drained. If not, another appointment may be required if it does not get better on its own or with medications. ° °HOME CARE INSTRUCTIONS  °Keep the skin and clothes clean around your abscess.  °If the abscess was drained, you will need to use gauze dressing to collect any draining pus. Dressings will typically need to be changed 3 or more  times a day.  °The infection may spread by skin contact with others. Avoid skin contact as much as possible.  °Practice good hygiene. This includes regular hand washing, cover any draining skin lesions, and do not share personal care items.  °SEEK MEDICAL CARE IF:  °You develop increased pain, swelling, redness, drainage, or bleeding in the wound site.  °You develop signs of generalized infection including muscle aches, chills, fever, or a general ill feeling.  °You have an oral temperature above 102° F (38.9° C).  °MAKE SURE YOU:  °Understand these instructions.  °Will watch your condition.  °Will get help right away if you are not doing well or get worse.  °Document Released: 09/12/2005 Document Revised: 08/15/2011 Document Reviewed: 07/06/2008 °[employer]® Patient Information ©2012 [employer]. ° ° ° °

## 2021-05-01 NOTE — ED Triage Notes (Signed)
recurrent skin infection x 3 days , upper left thigh and hip area.

## 2021-06-15 IMAGING — CR DG SHOULDER 2+V*L*
3 series · 3 of 3 positions shown · non-contrast
Comparison: None.

CLINICAL DATA: Left shoulder pain. Worsening symptoms over the last
month. No acute injury or prior relevant surgery.

EXAM:
LEFT SHOULDER - 2+ VIEW

[w shoulder grashey left]
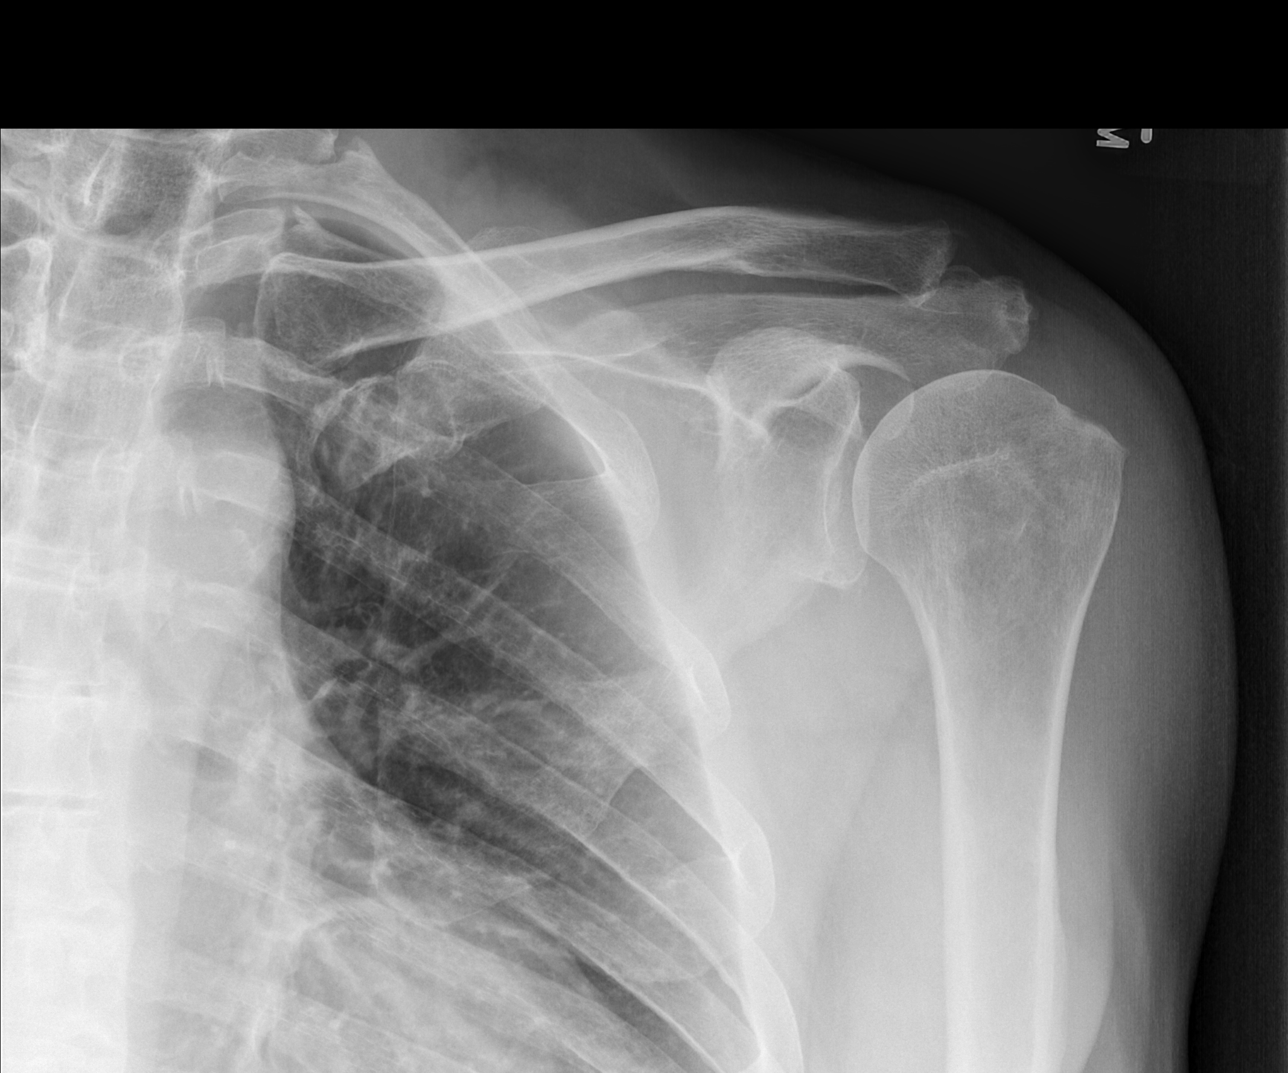

[w shoulder y view left]
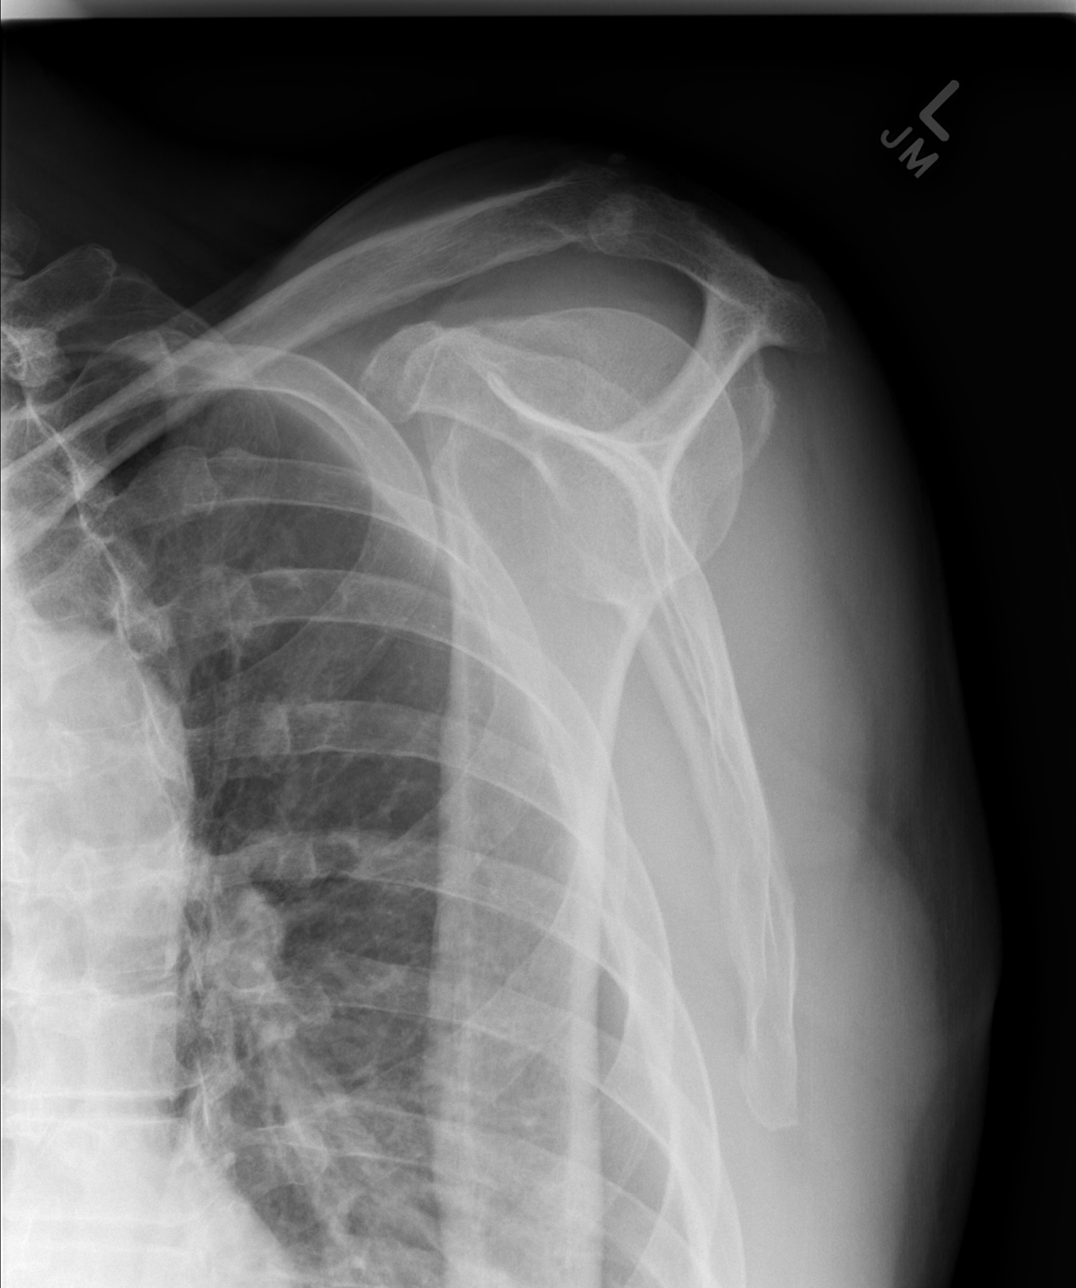

[w shoulder axillary left *]
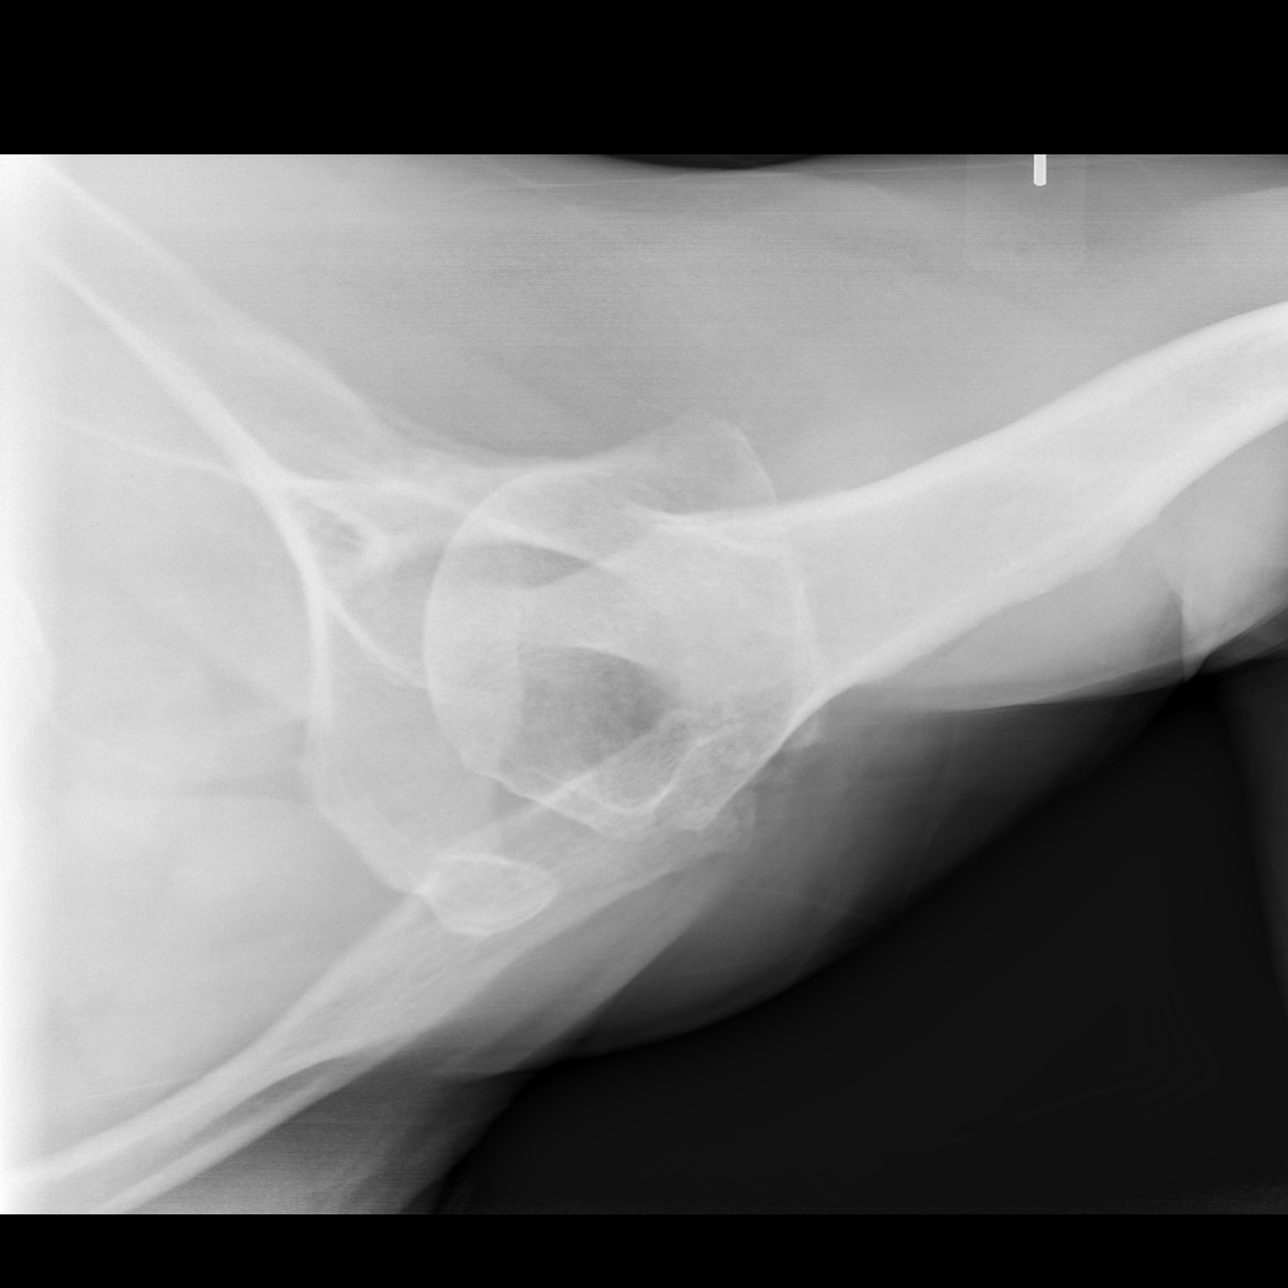

[3 of 3 positions shown; findings below may reference images not displayed]

FINDINGS: The mineralization and alignment are normal. There is no evidence of
acute fracture or dislocation. The subacromial space is preserved.
There are mild acromioclavicular degenerative changes.
IMPRESSION: No acute osseous findings. Mild acromioclavicular degenerative
changes.

## 2021-06-20 ENCOUNTER — Other Ambulatory Visit: Payer: Self-pay

## 2021-06-20 ENCOUNTER — Encounter (HOSPITAL_BASED_OUTPATIENT_CLINIC_OR_DEPARTMENT_OTHER): Payer: Self-pay

## 2021-06-20 ENCOUNTER — Emergency Department (HOSPITAL_BASED_OUTPATIENT_CLINIC_OR_DEPARTMENT_OTHER)
Admission: EM | Admit: 2021-06-20 | Discharge: 2021-06-20 | Disposition: A | Discharge status: Home / Self Care | Payer: Medicaid Other | Attending: Emergency Medicine | Admitting: Emergency Medicine

## 2021-06-20 DIAGNOSIS — R2241 Localized swelling, mass and lump, right lower limb: Secondary | ICD-10-CM | POA: Diagnosis present

## 2021-06-20 DIAGNOSIS — Z7984 Long term (current) use of oral hypoglycemic drugs: Secondary | ICD-10-CM | POA: Diagnosis not present

## 2021-06-20 DIAGNOSIS — E119 Type 2 diabetes mellitus without complications: Secondary | ICD-10-CM | POA: Insufficient documentation

## 2021-06-20 DIAGNOSIS — L0231 Cutaneous abscess of buttock: Secondary | ICD-10-CM | POA: Diagnosis not present

## 2021-06-20 DIAGNOSIS — F17210 Nicotine dependence, cigarettes, uncomplicated: Secondary | ICD-10-CM | POA: Diagnosis not present

## 2021-06-20 MED ORDER — DOXYCYCLINE HYCLATE 100 MG PO CAPS: 100.0000 mg | ORAL_CAPSULE | Freq: Two times a day (BID) | ORAL | 0 refills | Status: AC

## 2021-06-20 MED ORDER — CEPHALEXIN 500 MG PO CAPS: 500.0000 mg | ORAL_CAPSULE | Freq: Four times a day (QID) | ORAL | 0 refills | Status: AC

## 2021-06-20 NOTE — ED Provider Notes (Signed)
[facility] EMERGENCY DEPARTMENT Provider Note   CSN: [id number] Arrival date & time: 06/20/21  1249     History Chief Complaint  Patient presents with   Abscess    Tony Patel. is a 55 y.o. male past medical history sniffing for abscess, back pain, diabetes, sciatica presenting to emergency department today with chief complaint of abscess to his right buttock x3 days.  He states it is sore to the touch and the pain is worse when sitting down.  He describes the pain as throbbing and rates it 9 out of 10 in severity.  He has been taking warm soaks and Epson salt which he thinks is helping.  There has been no drainage from the abscess.  He denies any fever or chills.  Patient states he last had his A1c checked by his PCP and it was 10.  He states this is an improvement as it was 12 before.  He has not taken any over-the-counter medications for symptoms prior to arrival.  He does take his metformin daily.  States he sometimes checks his blood sugar but not always.  He denies any nausea, emesis, polyuria or polydipsia.   Patient last seen for abscess 05/01/2021 with incision and drainage.  He was treated with antibiotics as well.   Past Medical History:  Diagnosis Date   Abscess    Back pain    Diabetes mellitus without complication (HCC)    Sciatica     There are no problems to display for this patient.   Past Surgical History:  Procedure Laterality Date   ROTATOR CUFF REPAIR Right    TONSILLECTOMY         History reviewed. No pertinent family history.  Social History   Tobacco Use   Smoking status: Every Day    Packs/day: 0.50    Pack years: 0.00    Types: Cigarettes   Smokeless tobacco: Never  Vaping Use   Vaping Use: Never used  Substance Use Topics   Alcohol use: Yes    Comment: occ   Drug use: No    Home Medications Prior to Admission medications   Medication Sig Start Date End Date Taking? Authorizing Provider  cephALEXin (KEFLEX) 500 MG  capsule Take 1 capsule (500 mg total) by mouth 4 (four) times daily for 7 days. 06/20/21 06/27/21 Yes Walisiewicz, Gusta Marksberry E, PA-C  doxycycline (VIBRAMYCIN) 100 MG capsule Take 1 capsule (100 mg total) by mouth 2 (two) times daily for 7 days. 06/20/21 06/27/21 Yes Walisiewicz, Caroleen Hamman, PA-C  HYDROcodone-acetaminophen (NORCO) 10-325 MG tablet Take 1 tablet by mouth every 6 (six) hours. 09/16/20   [provider]  ibuprofen (ADVIL) 800 MG tablet Take 1 tablet (800 mg total) by mouth 2 (two) times daily. 12/24/19   [name] A, PA-C  JANUVIA 50 MG tablet Take 50 mg by mouth daily. 12/07/19   [provider]  meloxicam (MOBIC) 7.5 MG tablet Take 7.5 mg by mouth daily. 08/23/20   [provider]  METFORMIN HCL PO Take 1,000 mg by mouth 2 (two) times daily.     [provider]  mupirocin nasal ointment (BACTROBAN) 2 % Apply in each nostril using Qtip twice a day for 5 days 12/22/20   [name], MD  rosuvastatin (CRESTOR) 20 MG tablet Take 20 mg by mouth daily. 09/27/20   [provider]    Allergies    Patient has no known allergies.  Review of Systems   Review of Systems  All other systems are reviewed and are negative for acute change except as noted in the HPI.  Physical Exam Updated Vital Signs BP (!) 119/91 (BP Location: Right Arm)   Pulse 83   Temp 98.3 F (36.8 C) (Oral)   Resp 18   Ht 5\' 10"  (1.778 m)   Wt 93 kg   SpO2 98%   BMI 29.41 kg/m   Physical Exam Vitals and nursing note reviewed.  Constitutional:      Appearance: He is well-developed. He is not ill-appearing or toxic-appearing.  HENT:     Head: Normocephalic and atraumatic.     Nose: Nose normal.  Eyes:     General: No scleral icterus.       Right eye: No discharge.        Left eye: No discharge.     Conjunctiva/sclera: Conjunctivae normal.  Neck:     Vascular: No JVD.  Cardiovascular:     Rate and Rhythm: Normal rate and regular rhythm.     Pulses: Normal  pulses.     Heart sounds: Normal heart sounds.  Pulmonary:     Effort: Pulmonary effort is normal.     Breath sounds: Normal breath sounds.  Abdominal:     General: There is no distension.  Musculoskeletal:        General: Normal range of motion.     Cervical back: Normal range of motion.  Skin:    General: Skin is warm and dry.     Comments: 3 x 2 cm area of induration on right buttock.  Not in the gluteal cleft, not consistent with pilonidal cyst.  The area is indurated with mild overlying erythema.  There is no palpable fluctuance.  No red streaking.  Neurological:     Mental Status: He is oriented to person, place, and time.     GCS: GCS eye subscore is 4. GCS verbal subscore is 5. GCS motor subscore is 6.     Comments: Fluent speech, no facial droop.  Psychiatric:        Behavior: Behavior normal.    ED Results / Procedures / Treatments   Labs (all labs ordered are listed, but only abnormal results are displayed) Labs Reviewed - No data to display  EKG None  Radiology No results found.  Procedures Procedures   Medications Ordered in ED Medications - No data to display  ED Course  I have reviewed the triage vital signs and the nursing notes.  Pertinent labs & imaging results that were available during my care of the patient were reviewed by me and considered in my medical decision making (see chart for details).    MDM Rules/Calculators/A&P                          History provided by patient with additional history obtained from chart review.    Patient presenting with abscess on right buttock.  Not consistent with pilonidal cyst.  He is afebrile and hemodynamically stable.  Area is palpated and is indurated however there is no palpable fluctuance.  Bedside ultrasound performed and shows no fluid pockets, there is cobblestoning consistent with cellulitis.  Patient has had multiple I&D's in the past.  Discussed with patient possibility of opening it today versus  antibiotic trial as there is no fluid pockets to indicate drainage is necessary.  Patient is agreeable with plan to start antibiotics.  He is a poorly controlled diabetic so will be aggressive  with coverage including doxycycline and Keflex.  Patient knows to continue warm soaks and using a compress on the area.  He plans to follow-up with PCP in 2 days for wound check.  Strict return precautions were discussed.  Patient discharged home in stable condition.   Portions of this note were generated with Scientist, clinical (histocompatibility and immunogenetics). Dictation errors may occur despite best attempts at proofreading.  Final Clinical Impression(s) / ED Diagnoses Final diagnoses:  Abscess of buttock, right    Rx / DC Orders ED Discharge Orders          Ordered    doxycycline (VIBRAMYCIN) 100 MG capsule  2 times daily        06/20/21 1553    cephALEXin (KEFLEX) 500 MG capsule  4 times daily        06/20/21 1553             Shanon Ace, PA-C 06/20/21 1554    Alvira Monday, MD 06/21/21 2210

## 2021-06-20 NOTE — ED Triage Notes (Signed)
Pt complains of buttock abscess for a few days. Painful to sit. Hx of same.

## 2021-06-20 NOTE — Discharge Instructions (Addendum)
To antibiotics were sent to the pharmacy for you.  Take them both to help with your abscess.  Continue warm Epson salt soaks and using a warm compress on the area.  Recommend he follow-up with your primary care doctor in 2 days for wound check to make sure it is getting better and it does not need to be opened up and drained.  Return to the ER for new or worsening symptoms.

## 2021-07-21 IMAGING — CR DG RIBS W/ CHEST 3+V*L*
3 series · 3 of 3 positions shown · non-contrast
Comparison: Chest x-ray dated December 14, 2019.

CLINICAL DATA: Left-sided rib pain for the past week after playing
football.

EXAM:
LEFT RIBS AND CHEST - 3+ VIEW

[w chest pa]
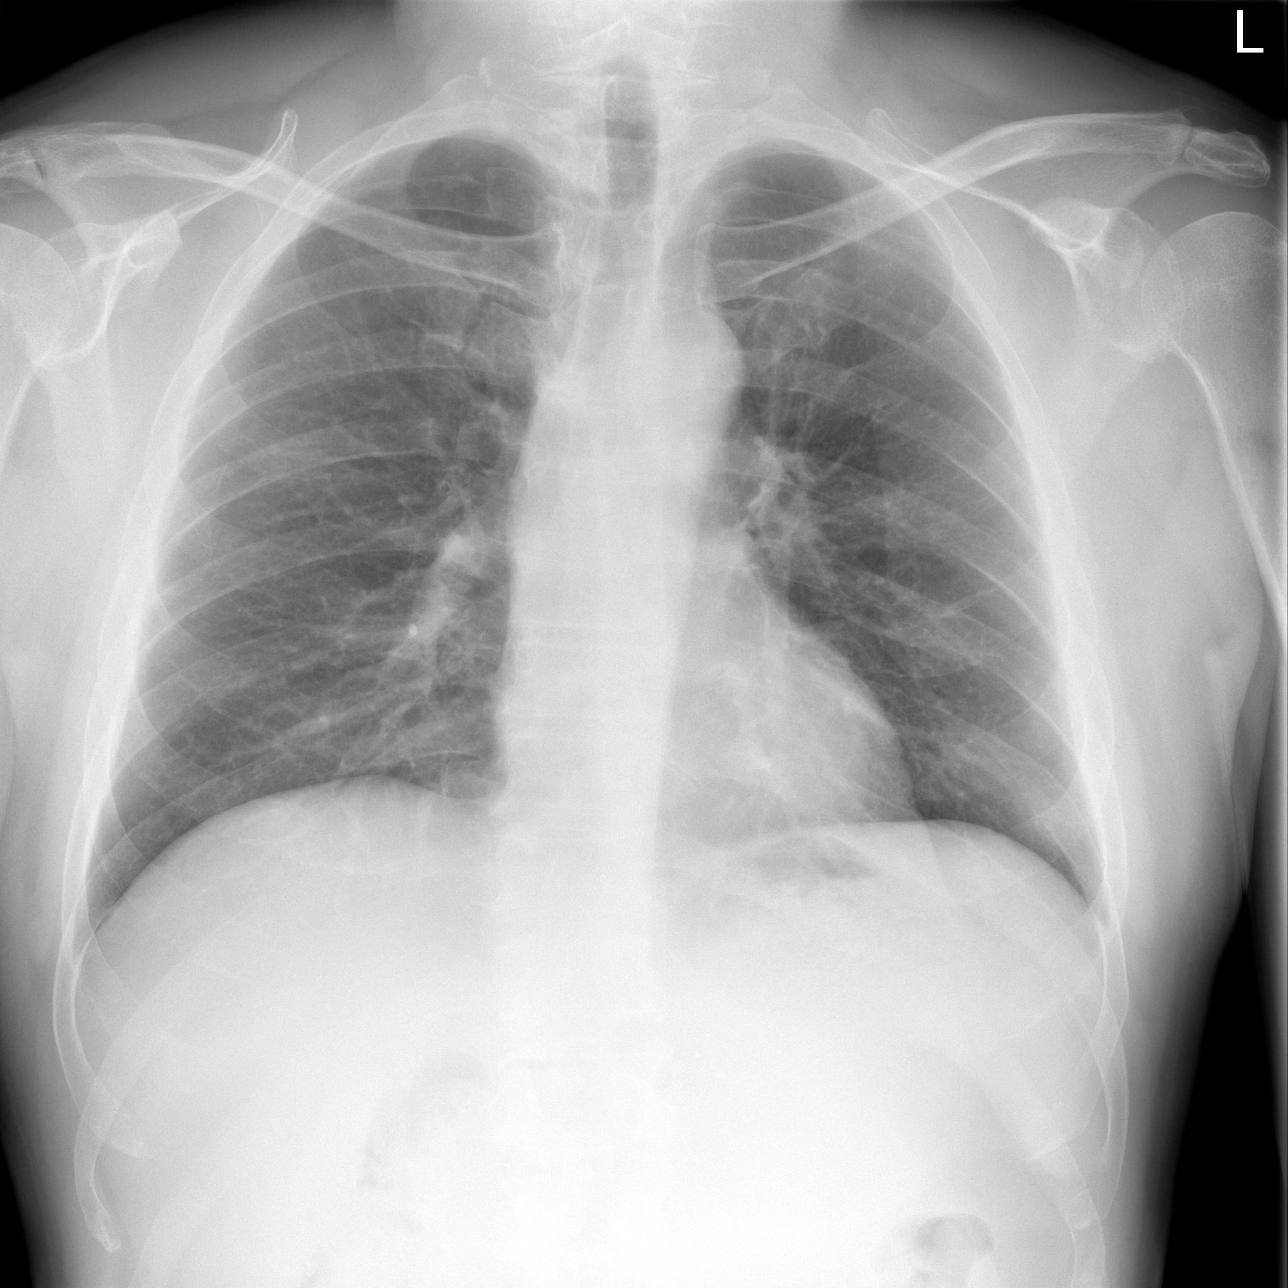

[w ribs ap/pa upper left]
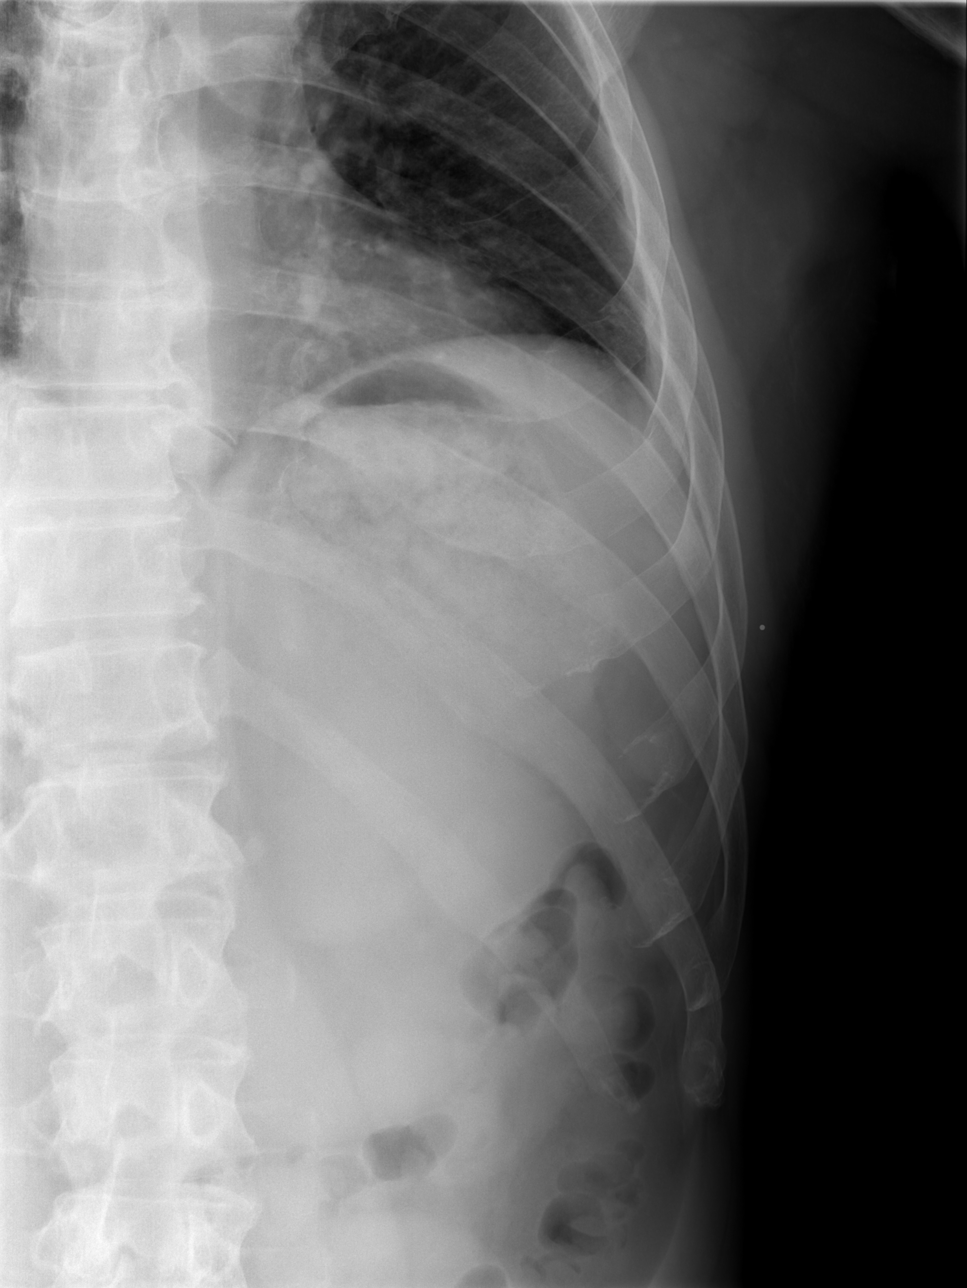

[w ribs oblique left]
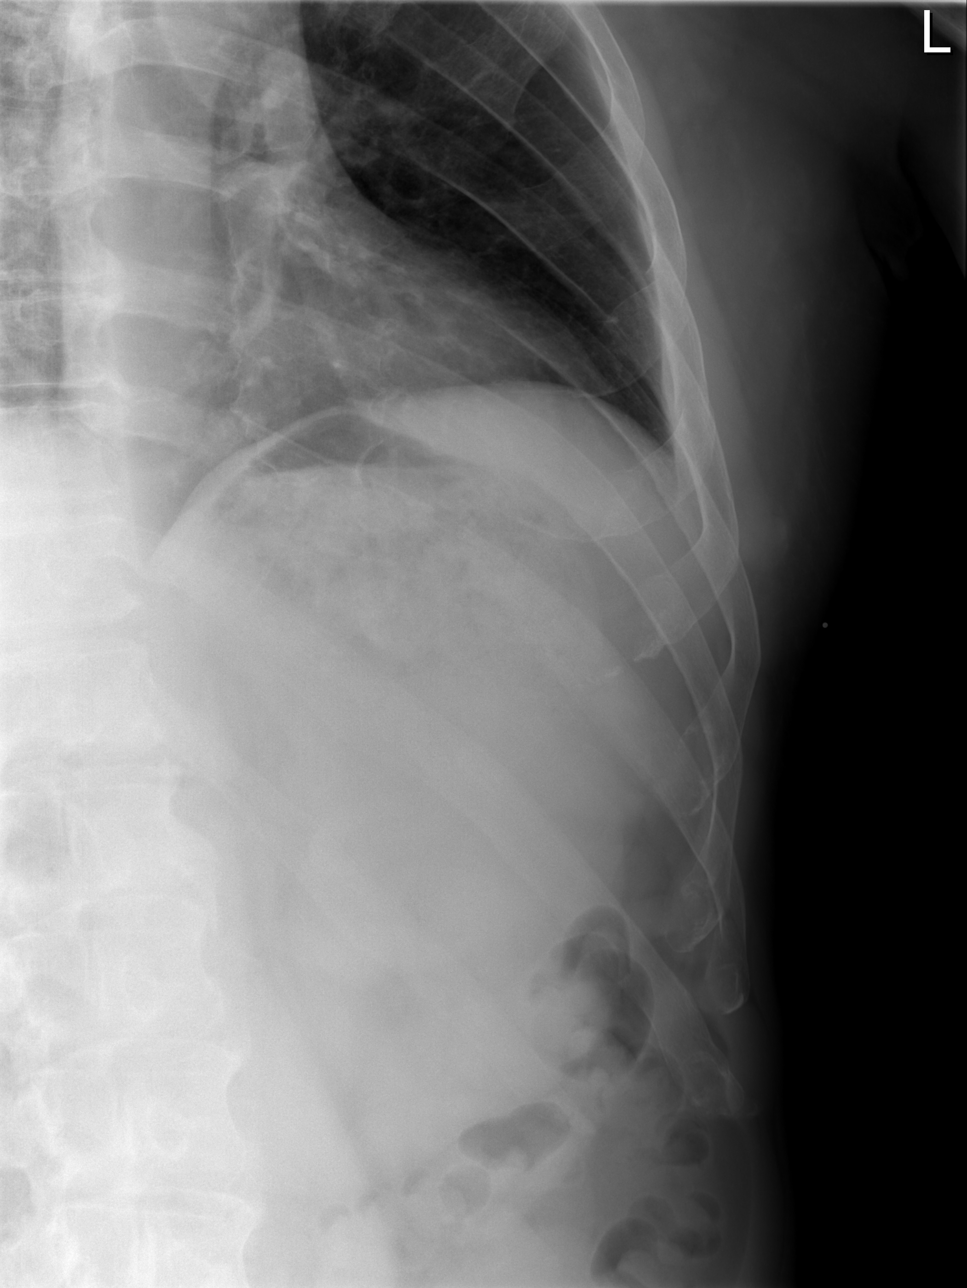

[3 of 3 positions shown; findings below may reference images not displayed]

FINDINGS: Acute minimally displaced fractures of the left anterolateral fourth
and fifth ribs.

The heart size and mediastinal contours are within normal limits.
Normal pulmonary vascularity. No focal consolidation, pleural
effusion, or pneumothorax.
IMPRESSION: 1. Acute minimally displaced fractures of the left anterolateral
fourth and fifth ribs.
2.  No active cardiopulmonary disease.

## 2021-10-30 ENCOUNTER — Encounter (HOSPITAL_BASED_OUTPATIENT_CLINIC_OR_DEPARTMENT_OTHER): Payer: Self-pay

## 2021-10-30 ENCOUNTER — Other Ambulatory Visit: Payer: Self-pay

## 2021-10-30 ENCOUNTER — Emergency Department (HOSPITAL_BASED_OUTPATIENT_CLINIC_OR_DEPARTMENT_OTHER)
Admission: EM | Admit: 2021-10-30 | Discharge: 2021-10-30 | Disposition: A | Discharge status: Home / Self Care | Payer: Medicaid Other | Attending: Emergency Medicine | Admitting: Emergency Medicine

## 2021-10-30 DIAGNOSIS — H6691 Otitis media, unspecified, right ear: Secondary | ICD-10-CM | POA: Insufficient documentation

## 2021-10-30 DIAGNOSIS — Z7984 Long term (current) use of oral hypoglycemic drugs: Secondary | ICD-10-CM | POA: Insufficient documentation

## 2021-10-30 DIAGNOSIS — L089 Local infection of the skin and subcutaneous tissue, unspecified: Secondary | ICD-10-CM

## 2021-10-30 DIAGNOSIS — R739 Hyperglycemia, unspecified: Secondary | ICD-10-CM

## 2021-10-30 DIAGNOSIS — F17210 Nicotine dependence, cigarettes, uncomplicated: Secondary | ICD-10-CM | POA: Insufficient documentation

## 2021-10-30 DIAGNOSIS — H9201 Otalgia, right ear: Secondary | ICD-10-CM | POA: Diagnosis present

## 2021-10-30 DIAGNOSIS — E1165 Type 2 diabetes mellitus with hyperglycemia: Secondary | ICD-10-CM | POA: Insufficient documentation

## 2021-10-30 DIAGNOSIS — L02415 Cutaneous abscess of right lower limb: Secondary | ICD-10-CM | POA: Insufficient documentation

## 2021-10-30 DIAGNOSIS — H60501 Unspecified acute noninfective otitis externa, right ear: Secondary | ICD-10-CM

## 2021-10-30 DIAGNOSIS — R7989 Other specified abnormal findings of blood chemistry: Secondary | ICD-10-CM

## 2021-10-30 LAB — COMPREHENSIVE METABOLIC PANEL
ALT: 30 U/L (ref 0–44)
AST: 31 U/L (ref 15–41)
Albumin: 4 g/dL (ref 3.5–5.0)
Alkaline Phosphatase: 59 U/L (ref 38–126)
Anion gap: 13 (ref 5–15)
BUN: 17 mg/dL (ref 6–20)
CO2: 21 mmol/L — ABNORMAL LOW (ref 22–32)
Calcium: 10 mg/dL (ref 8.9–10.3)
Chloride: 96 mmol/L — ABNORMAL LOW (ref 98–111)
Creatinine, Ser: 1.35 mg/dL — ABNORMAL HIGH (ref 0.61–1.24)
GFR, Estimated: >60 mL/min (ref >60)
Glucose, Bld: 462 mg/dL — ABNORMAL HIGH (ref 70–99)
Potassium: 4.4 mmol/L (ref 3.5–5.1)
Sodium: 130 mmol/L — ABNORMAL LOW (ref 135–145)
Total Bilirubin: 0.4 mg/dL (ref 0.3–1.2)
Total Protein: 8 g/dL (ref 6.5–8.1)

## 2021-10-30 LAB — CBG MONITORING, ED
Glucose-Capillary: 437 mg/dL — ABNORMAL HIGH (ref 70–99)
Glucose-Capillary: 267 mg/dL — ABNORMAL HIGH (ref 70–99)

## 2021-10-30 LAB — CBC WITH DIFFERENTIAL/PLATELET
Abs Immature Granulocytes: 0.01 10*3/uL (ref 0.00–0.07)
Basophils Absolute: 0.1 10*3/uL (ref 0.0–0.1)
Basophils Relative: 2 %
Eosinophils Absolute: 0.1 10*3/uL (ref 0.0–0.5)
Eosinophils Relative: 2 %
HCT: 45.3 % (ref 39.0–52.0)
Hemoglobin: 16 g/dL (ref 13.0–17.0)
Immature Granulocytes: 0 %
Lymphocytes Relative: 38 %
Lymphs Abs: 2.2 10*3/uL (ref 0.7–4.0)
MCH: 30.8 pg (ref 26.0–34.0)
MCHC: 35.3 g/dL (ref 30.0–36.0)
MCV: 87.3 fL (ref 80.0–100.0)
Monocytes Absolute: 0.4 10*3/uL (ref 0.1–1.0)
Monocytes Relative: 6 %
Neutro Abs: 3 10*3/uL (ref 1.7–7.7)
Neutrophils Relative %: 52 %
Platelets: 241 10*3/uL (ref 150–400)
RBC: 5.19 MIL/uL (ref 4.22–5.81)
RDW: 12.6 % (ref 11.5–15.5)
WBC: 5.8 10*3/uL (ref 4.0–10.5)
nRBC: 0 % (ref 0.0–0.2)

## 2021-10-30 MED ORDER — SODIUM CHLORIDE 0.9 % IV BOLUS
1000.0000 mL | Freq: Once | INTRAVENOUS | Status: AC
  Administered 2021-10-30: 1000 mL via INTRAVENOUS

## 2021-10-30 MED ORDER — DOXYCYCLINE HYCLATE 100 MG PO CAPS: 100.0000 mg | ORAL_CAPSULE | Freq: Two times a day (BID) | ORAL | 0 refills | Status: DC

## 2021-10-30 MED ORDER — CIPROFLOXACIN-DEXAMETHASONE 0.3-0.1 % OT SUSP
4.0000 [drp] | Freq: Two times a day (BID) | OTIC | Status: AC
  Administered 2021-10-30: 4 [drp] via OTIC
  Filled 2021-10-30: qty 7.5

## 2021-10-30 MED ORDER — INSULIN ASPART 100 UNIT/ML IJ SOLN
5.0000 [IU] | Freq: Once | INTRAMUSCULAR | Status: AC
  Administered 2021-10-30: 5 [IU] via SUBCUTANEOUS

## 2021-10-30 MED ORDER — SODIUM CHLORIDE 0.9 % IV SOLN
1.0000 g | Freq: Once | INTRAVENOUS | Status: AC
  Administered 2021-10-30: 1 g via INTRAVENOUS
  Filled 2021-10-30: qty 10

## 2021-10-30 NOTE — Discharge Instructions (Addendum)
Place 4 drops of the ciprodex drops in the Right ear for 7 days. Follow up with your doctor asap for your blood sugars and your elevated creatinine (kidney function).  Contact a health care provider if you have: More redness, swelling, or pain around your abscess. More fluid or blood coming from your abscess. Warm skin around your abscess. More pus or a bad smell coming from your abscess. A fever. Muscle aches. Chills or a general ill feeling. Get help right away if you: Have severe pain. See red streaks on your skin spreading away from the abscess.

## 2021-10-30 NOTE — ED Provider Notes (Signed)
[facility] EMERGENCY DEPARTMENT Provider Note   CSN: [id number] Arrival date & time: 10/30/21  1501     History Chief Complaint  Patient presents with   Abscess   Otalgia    Tony Sine. is a 55 y.o. male w/ pmh of recurrent skin abscess, DM (last HGB A1C 9) who presents with ear pain and abscess. He complains of abscess on the R upper thigh that has drained twice. It has gotten a bit darker and so the patient thought he might need antibiotics.  He also has an infected hair follicle in the medial right thigh.  Patient also complaining of pain in the right ear that has been going on for several days.  He noticed decreased hearing, pain with touching the ear, no drainage, no barotrauma.  Patient notably has elevated blood sugar of 470 today.  He states that he may not have been taking his metformin correctly because he might of mix it up with his ibuprofen.   Abscess Associated symptoms: no fever   Otalgia Associated symptoms: no ear discharge and no fever       Past Medical History:  Diagnosis Date   Abscess    Back pain    Diabetes mellitus without complication (HCC)    Sciatica     There are no problems to display for this patient.   Past Surgical History:  Procedure Laterality Date   ROTATOR CUFF REPAIR Right    TONSILLECTOMY         No family history on file.  Social History   Tobacco Use   Smoking status: Every Day    Packs/day: 0.50    Types: Cigarettes   Smokeless tobacco: Never  Vaping Use   Vaping Use: Never used  Substance Use Topics   Alcohol use: Yes    Comment: occ   Drug use: No    Home Medications Prior to Admission medications   Medication Sig Start Date End Date Taking? Authorizing Provider  HYDROcodone-acetaminophen (NORCO) 10-325 MG tablet Take 1 tablet by mouth every 6 (six) hours. 09/16/20   [provider]  ibuprofen (ADVIL) 800 MG tablet Take 1 tablet (800 mg total) by mouth 2 (two) times daily. 12/24/19    [name] A, PA-C  JANUVIA 50 MG tablet Take 50 mg by mouth daily. 12/07/19   [provider]  meloxicam (MOBIC) 7.5 MG tablet Take 7.5 mg by mouth daily. 08/23/20   [provider]  METFORMIN HCL PO Take 1,000 mg by mouth 2 (two) times daily.     [provider]  mupirocin nasal ointment (BACTROBAN) 2 % Apply in each nostril using Qtip twice a day for 5 days 12/22/20   [name], MD  rosuvastatin (CRESTOR) 20 MG tablet Take 20 mg by mouth daily. 09/27/20   [provider]    Allergies    Patient has no known allergies.  Review of Systems   Review of Systems  Constitutional:  Negative for fever.  HENT:  Positive for ear pain. Negative for ear discharge.   Skin:  Positive for color change.  Ten systems reviewed and are negative for acute change, except as noted in the HPI.   Physical Exam Updated Vital Signs BP 106/90 (BP Location: Left Arm)   Pulse (!) 109   Temp 98.3 F (36.8 C) (Oral)   Resp 14   Ht 5\' 10"  (1.778 m)   Wt 90.7 kg   SpO2 100%   BMI 28.70 kg/m  Physical Exam Vitals and nursing note reviewed.  Constitutional:      General: He is not in acute distress.    Appearance: He is well-developed. He is not diaphoretic.  HENT:     Head: Normocephalic and atraumatic.  Eyes:     General: No scleral icterus.    Conjunctiva/sclera: Conjunctivae normal.  Cardiovascular:     Rate and Rhythm: Normal rate and regular rhythm.     Heart sounds: Normal heart sounds.  Pulmonary:     Effort: Pulmonary effort is normal. No respiratory distress.     Breath sounds: Normal breath sounds.  Abdominal:     Palpations: Abdomen is soft.     Tenderness: There is no abdominal tenderness.  Musculoskeletal:     Cervical back: Normal range of motion and neck supple.  Skin:    General: Skin is warm and dry.     Comments: 2 cm area or induration on upper R thigh consistent with resolving Abscess Folliculitis medial R thigh NO inguinal  lymphadenopathy  Neurological:     Mental Status: He is alert.  Psychiatric:        Behavior: Behavior normal.    ED Results / Procedures / Treatments   Labs (all labs ordered are listed, but only abnormal results are displayed) Labs Reviewed  COMPREHENSIVE METABOLIC PANEL - Abnormal; Notable for the following components:      Result Value   Sodium 130 (*)    Chloride 96 (*)    CO2 21 (*)    Glucose, Bld 462 (*)    Creatinine, Ser 1.35 (*)    All other components within normal limits  CBG MONITORING, ED - Abnormal; Notable for the following components:   Glucose-Capillary 437 (*)    All other components within normal limits  CBC WITH DIFFERENTIAL/PLATELET  URINALYSIS, ROUTINE W REFLEX MICROSCOPIC    EKG None  Radiology No results found.  Procedures Procedures   Medications Ordered in ED Medications  sodium chloride 0.9 % bolus 1,000 mL (has no administration in time range)  insulin aspart (novoLOG) injection 5 Units (has no administration in time range)  cefTRIAXone (ROCEPHIN) 1 g in sodium chloride 0.9 % 100 mL IVPB (has no administration in time range)    ED Course  I have reviewed the triage vital signs and the nursing notes.  Pertinent labs & imaging results that were available during my care of the patient were reviewed by me and considered in my medical decision making (see chart for details).    MDM Rules/Calculators/A&P                           Patient here with complaint of abscess.  Physical examination shows patient has a healing abscess of the medial thigh, folliculitis of the inner thigh, right sided otitis media.  I reviewed labs which shows glucose of 462.  CBC without elevated white blood cell count or other abnormalities.  Patient given fluids, subcutaneous insulin.  He was also given Rocephin.  Patient will be discharged with antibiotics and outpatient follow-up with his PCP for better glucose control.  Discussed outpatient follow-up and return  precautions.  He appears appropriate for discharge at this time Final Clinical Impression(s) / ED Diagnoses Final diagnoses:  None    Rx / DC Orders ED Discharge Orders     None        Arthor Captain, PA-C 10/31/21 1047    Gloris Manchester, MD 10/31/21 [phone]

## 2021-10-30 NOTE — ED Triage Notes (Signed)
Pt arrives ambulatory to ED with reports of boil to right inner groin/thigh X2 weeks states it started to drain on its own and then turned purple 'so I think I might need antibiotics. Pt also c/o cold symptoms with pain to right ear also for 2 weeks. C/o cough and sinus pain. Also reports his blood sugar dropped today and would like to get it checked.

## 2021-10-30 NOTE — ED Notes (Signed)
Pt states he got his ibuprofen and metformin mixed up and missed some doses of his metformin, thinking he was taking it but taking the ibuprofen instead.

## 2021-11-10 DIAGNOSIS — I2111 ST elevation (STEMI) myocardial infarction involving right coronary artery: Secondary | ICD-10-CM | POA: Insufficient documentation

## 2021-11-29 DIAGNOSIS — I10 Essential (primary) hypertension: Secondary | ICD-10-CM | POA: Insufficient documentation

## 2021-11-29 DIAGNOSIS — E782 Mixed hyperlipidemia: Secondary | ICD-10-CM | POA: Insufficient documentation

## 2021-11-29 DIAGNOSIS — I25118 Atherosclerotic heart disease of native coronary artery with other forms of angina pectoris: Secondary | ICD-10-CM | POA: Insufficient documentation

## 2021-11-29 DIAGNOSIS — F1910 Other psychoactive substance abuse, uncomplicated: Secondary | ICD-10-CM | POA: Insufficient documentation

## 2021-11-29 DIAGNOSIS — E663 Overweight: Secondary | ICD-10-CM | POA: Insufficient documentation

## 2022-02-23 ENCOUNTER — Other Ambulatory Visit: Payer: Self-pay

## 2022-02-23 ENCOUNTER — Emergency Department (HOSPITAL_BASED_OUTPATIENT_CLINIC_OR_DEPARTMENT_OTHER)
Admission: EM | Admit: 2022-02-23 | Discharge: 2022-02-23 | Disposition: A | Discharge status: Home / Self Care | Payer: Medicaid Other | Attending: Emergency Medicine | Admitting: Emergency Medicine

## 2022-02-23 ENCOUNTER — Encounter (HOSPITAL_BASED_OUTPATIENT_CLINIC_OR_DEPARTMENT_OTHER): Payer: Self-pay

## 2022-02-23 DIAGNOSIS — E119 Type 2 diabetes mellitus without complications: Secondary | ICD-10-CM | POA: Diagnosis not present

## 2022-02-23 DIAGNOSIS — L02416 Cutaneous abscess of left lower limb: Secondary | ICD-10-CM | POA: Diagnosis present

## 2022-02-23 DIAGNOSIS — L0291 Cutaneous abscess, unspecified: Secondary | ICD-10-CM

## 2022-02-23 HISTORY — DX: Acute myocardial infarction, unspecified: I21.9

## 2022-02-23 MED ORDER — DOXYCYCLINE HYCLATE 100 MG PO CAPS: 100.0000 mg | ORAL_CAPSULE | Freq: Two times a day (BID) | ORAL | 0 refills | Status: DC

## 2022-02-23 MED ORDER — SULFAMETHOXAZOLE-TRIMETHOPRIM 800-160 MG PO TABS: 1.0000 | ORAL_TABLET | Freq: Two times a day (BID) | ORAL | 0 refills | Status: DC

## 2022-02-23 NOTE — Discharge Instructions (Addendum)
Work on controlling your diabetes to help prevent these. ?Contrinue warm compresses. Take doxy twice daily for 10 days.  ?Return or see improvement in 48 hours.  He started having pain with movement of the limbs he should return for the ED.  Additionally start having fevers or things spread in 48 hours instead of improved.  See your primary on Monday for wound recheck. ?

## 2022-02-23 NOTE — ED Triage Notes (Signed)
Pt c/o abscess to left knee-NAD-steady gait ?

## 2022-02-23 NOTE — ED Provider Notes (Signed)
?[facility] EMERGENCY DEPARTMENT ?Provider Note ? ? ?CSN: [id number] ?Arrival date & time: 02/23/22  1606 ? ?  ? ?History ? ?Chief Complaint  ?Patient presents with  ? Abscess  ? ? ?Tony Patel. is a 56 y.o. male. ? ? ?Abscess ? ? Patient is a very pleasant gentleman with history of poorly controlled diabetes, prior MI with stent placement and frequent abscesses presenting today with abscess to left knee.  He noticed it about a week ago, he has been doing warm compresses with some relief.  He endorses purulent drainage over the last few days, none this morning.  Has not been on any recent antibiotics, denies any pain with movement of the joint. ? ?Home Medications ?Prior to Admission medications   ?Medication Sig Start Date End Date Taking? Authorizing Provider  ?sulfamethoxazole-trimethoprim (BACTRIM DS) 800-160 MG tablet Take 1 tablet by mouth 2 (two) times daily for 7 days. 02/23/22 03/02/22 Yes Theron Arista, PA-C  ?doxycycline (VIBRAMYCIN) 100 MG capsule Take 1 capsule (100 mg total) by mouth 2 (two) times daily. 10/30/21   Arthor Captain, PA-C  ?HYDROcodone-acetaminophen (NORCO) 10-325 MG tablet Take 1 tablet by mouth every 6 (six) hours. 09/16/20   [provider]  ?ibuprofen (ADVIL) 800 MG tablet Take 1 tablet (800 mg total) by mouth 2 (two) times daily. 12/24/19   [name] A, PA-C  ?JANUVIA 50 MG tablet Take 50 mg by mouth daily. 12/07/19   [provider]  ?meloxicam (MOBIC) 7.5 MG tablet Take 7.5 mg by mouth daily. 08/23/20   [provider]  ?METFORMIN HCL PO Take 1,000 mg by mouth 2 (two) times daily.     [provider]  ?mupirocin nasal ointment (BACTROBAN) 2 % Apply in each nostril using Qtip twice a day for 5 days 12/22/20   [name], MD  ?rosuvastatin (CRESTOR) 20 MG tablet Take 20 mg by mouth daily. 09/27/20   [provider]  ?   ? ?Allergies    ?Patient has no known allergies.   ? ?Review of Systems   ?Review of  Systems ? ?Physical Exam ?Updated Vital Signs ?BP 105/81 (BP Location: Left Arm)   Pulse 81   Temp 97.8 ?F (36.6 ?C) (Oral)   Resp 18   Ht 5\' 10"  (1.778 m)   Wt 92.5 kg   SpO2 100%   BMI 29.27 kg/m?  ?Physical Exam ?Vitals and nursing note reviewed. Exam conducted with a chaperone present.  ?Constitutional:   ?   General: He is not in acute distress. ?   Appearance: Normal appearance.  ?HENT:  ?   Head: Normocephalic and atraumatic.  ?Eyes:  ?   General: No scleral icterus. ?   Extraocular Movements: Extraocular movements intact.  ?   Pupils: Pupils are equal, round, and reactive to light.  ?Cardiovascular:  ?   Pulses: Normal pulses.  ?Musculoskeletal:     ?   General: Normal range of motion.  ?   Comments: Patient has excellent active and passive range of motion to the knee without any elicitation of pain  ?Skin: ?   Coloration: Skin is not jaundiced.  ?   Comments: See photo. Indurated, do not palpate any fluctuance  ?Neurological:  ?   Mental Status: He is alert. Mental status is at baseline.  ?   Coordination: Coordination normal.  ? ? ? ?ED Results / Procedures / Treatments   ?Labs ?(all labs ordered are listed, but only abnormal results are displayed) ?Labs  Reviewed - No data to display ? ?EKG ?None ? ?Radiology ?No results found. ? ?Procedures ?Procedures  ? ? ?Medications Ordered in ED ?Medications - No data to display ? ?ED Course/ Medical Decision Making/ A&P ?  ?                        ?Medical Decision Making ?Risk ?Prescription drug management. ? ? ?Patient with presentation complicated by uncontrolled diabetes presents with abscess to left knee.  He has excellent range of motion, considered but ultimately think a septic joint is unlikely.  Considered active cyst but given location that seems unlikely.  There does not appear to be surrounding cellulitis yet, the abscess itself is indurated without fluctuance.  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  He has history of similar, I  suspect is likely secondary to poorly controlled diabetes.  Do not feel this would benefit from I&D although that was a consideration initially.  Will discharge with Bactrim and strict return precautions, discharged in stable condition. ? ? ? ? ? ? ? ?Final Clinical Impression(s) / ED Diagnoses ?Final diagnoses:  ?Abscess  ? ? ?Rx / DC Orders ?ED Discharge Orders   ? ?      Ordered  ?  sulfamethoxazole-trimethoprim (BACTRIM DS) 800-160 MG tablet  2 times daily       ? 02/23/22 1652  ? ?  ?  ? ?  ? ? ?  ?Theron Arista, PA-C ?02/23/22 1656 ? ?  ?Arby Barrette, MD ?02/23/22 2150 ? ?

## 2022-05-24 ENCOUNTER — Other Ambulatory Visit: Payer: Self-pay

## 2022-05-24 ENCOUNTER — Emergency Department (HOSPITAL_BASED_OUTPATIENT_CLINIC_OR_DEPARTMENT_OTHER)
Admission: EM | Admit: 2022-05-24 | Discharge: 2022-05-24 | Disposition: A | Discharge status: Home / Self Care | Payer: Medicaid Other | Attending: Emergency Medicine | Admitting: Emergency Medicine

## 2022-05-24 ENCOUNTER — Encounter (HOSPITAL_BASED_OUTPATIENT_CLINIC_OR_DEPARTMENT_OTHER): Payer: Self-pay

## 2022-05-24 DIAGNOSIS — E119 Type 2 diabetes mellitus without complications: Secondary | ICD-10-CM | POA: Insufficient documentation

## 2022-05-24 DIAGNOSIS — Z79899 Other long term (current) drug therapy: Secondary | ICD-10-CM | POA: Insufficient documentation

## 2022-05-24 DIAGNOSIS — Z7984 Long term (current) use of oral hypoglycemic drugs: Secondary | ICD-10-CM | POA: Diagnosis not present

## 2022-05-24 DIAGNOSIS — L739 Follicular disorder, unspecified: Secondary | ICD-10-CM | POA: Insufficient documentation

## 2022-05-24 DIAGNOSIS — L03311 Cellulitis of abdominal wall: Secondary | ICD-10-CM | POA: Diagnosis not present

## 2022-05-24 MED ORDER — CEPHALEXIN 500 MG PO CAPS: 500.0000 mg | ORAL_CAPSULE | Freq: Four times a day (QID) | ORAL | 0 refills | Status: DC

## 2022-05-24 NOTE — ED Provider Notes (Signed)
[facility] Provider Note   CSN: [id number] Arrival date & time: 05/24/22  [id number]     History  Chief Complaint  Patient presents with   Abscess    Tony Patel. is a 56 y.o. male with a past medical history of diabetes presenting today with the complaint of an abscess.  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  Yesterday he noticed an ingrown hair to his right lower abdomen and pulled it out.  Later last night and this morning he noted erythema to the surrounding skin and he reports is painful.  No fevers, chills or at home remedies tried.  Is not sure if his blood sugars are controlled because he does not check at home but says his most recent A1c was abnormal than previous  Also is wondering if I know of any place for him to get his ear hair trimmed because he is having on and off discomfort in this ear   Abscess      Home Medications Prior to Admission medications   Medication Sig Start Date End Date Taking? Authorizing Provider  cephALEXin (KEFLEX) 500 MG capsule Take 1 capsule (500 mg total) by mouth 4 (four) times daily. 05/24/22  Yes Tykeisha Peer A, PA-C  doxycycline (VIBRAMYCIN) 100 MG capsule Take 1 capsule (100 mg total) by mouth 2 (two) times daily. 02/23/22   Sherrill Raring, PA-C  HYDROcodone-acetaminophen (NORCO) 10-325 MG tablet Take 1 tablet by mouth every 6 (six) hours. 09/16/20   [provider]  ibuprofen (ADVIL) 800 MG tablet Take 1 tablet (800 mg total) by mouth 2 (two) times daily. 12/24/19   Nuala Alpha A, PA-C  JANUVIA 50 MG tablet Take 50 mg by mouth daily. 12/07/19   [provider]  meloxicam (MOBIC) 7.5 MG tablet Take 7.5 mg by mouth daily. 08/23/20   [provider]  METFORMIN HCL PO Take 1,000 mg by mouth 2 (two) times daily.     [provider]  mupirocin nasal ointment (BACTROBAN) 2 % Apply in each nostril using Qtip twice a day for 5 days 12/22/20   Truddie Hidden, MD  rosuvastatin  (CRESTOR) 20 MG tablet Take 20 mg by mouth daily. 09/27/20   [provider]      Allergies    Patient has no known allergies.    Review of Systems   Review of Systems  Physical Exam Updated Vital Signs BP 125/90 (BP Location: Left Arm)   Pulse 74   Temp 98.3 F (36.8 C) (Oral)   Resp 18   Ht 5\' 10"  (1.778 m)   Wt 90.7 kg   SpO2 99%   BMI 28.70 kg/m  Physical Exam Vitals and nursing note reviewed.  Constitutional:      Appearance: Normal appearance.  HENT:     Head: Normocephalic and atraumatic.     Ears:     Comments: Unable to visualize TM due to hair in patient's ear.  Difficulty visualizing most of his ear canal but no appreciable drainage, erythema or swelling.  Nontender mastoid and tragus Eyes:     General: No scleral icterus.    Conjunctiva/sclera: Conjunctivae normal.  Pulmonary:     Effort: Pulmonary effort is normal. No respiratory distress.  Abdominal:     General: Abdomen is flat.     Palpations: Abdomen is soft.     Comments: Small area where it appears there was previously an ingrown hair that is inflamed with surrounding cellulitic skin.  No  fluctuance.  Mild induration.  Warmth present  Skin:    General: Skin is warm and dry.     Findings: Erythema present. No rash.  Neurological:     Mental Status: He is alert.  Psychiatric:        Mood and Affect: Mood normal.     ED Results / Procedures / Treatments   Labs (all labs ordered are listed, but only abnormal results are displayed) Labs Reviewed - No data to display  EKG None  Radiology No results found.  Procedures Procedures   Medications Ordered in ED Medications - No data to display  ED Course/ Medical Decision Making/ A&P                           Medical Decision Making Risk Prescription drug management.   56 year old male with a past medical history of diabetes and recurrent abscesses presenting today with a concern of a developing abscess.  Physical exam  consistent with cellulitis, likely stemming from previous folliculitis that became infected from manipulation.  No fluctuance noted.  Infection seems superficial.  Will treat with Keflex.  I am unable to visualize the patient's TMs.  No erythema or drainage noted in the parts of the ear canal that I am able to see  Treatment: Patient will be discharged home with Keflex for his cellulitis.  He will follow-up with the barber or his PCP about his ear hair.  He is agreeable.   Final Clinical Impression(s) / ED Diagnoses Final diagnoses:  Folliculitis  Cellulitis of abdominal wall    Rx / DC Orders ED Discharge Orders          Ordered    cephALEXin (KEFLEX) 500 MG capsule  4 times daily        05/24/22 1040           Results and diagnoses were explained to the patient. Return precautions discussed in full. Patient had no additional questions and expressed complete understanding.   This chart was dictated using voice recognition software.  Despite best efforts to proofread,  errors can occur which can change the documentation meaning.    Darliss Ridgel 05/24/22 1046    Wyvonnia Dusky, MD 05/24/22 1056

## 2022-05-24 NOTE — Discharge Instructions (Signed)
There is no abscess to be drained today.  I am starting you on antibiotics.  The name of the antibiotic is cephalexin.  Try and talk to your barber about trimming your ear hair.  Also, revisit this with your primary care provider as they should be able to assist in finding a proper place for this kind of treatment.  Return with any fevers, chills or worsening symptoms.

## 2022-05-24 NOTE — ED Triage Notes (Signed)
Pt ambulatory to room 10, A/OX4. C/O abscess on abd X4 days, reports he pulled hair out yesterday. Hx of abscess and boils on butt. NAD in triage, VSS

## 2022-05-27 ENCOUNTER — Emergency Department (HOSPITAL_BASED_OUTPATIENT_CLINIC_OR_DEPARTMENT_OTHER)
Admission: EM | Admit: 2022-05-27 | Discharge: 2022-05-27 | Disposition: A | Discharge status: Home / Self Care | Payer: Medicaid Other | Attending: Emergency Medicine | Admitting: Emergency Medicine

## 2022-05-27 ENCOUNTER — Encounter (HOSPITAL_BASED_OUTPATIENT_CLINIC_OR_DEPARTMENT_OTHER): Payer: Self-pay

## 2022-05-27 ENCOUNTER — Other Ambulatory Visit: Payer: Self-pay

## 2022-05-27 DIAGNOSIS — L03311 Cellulitis of abdominal wall: Secondary | ICD-10-CM | POA: Diagnosis not present

## 2022-05-27 DIAGNOSIS — R21 Rash and other nonspecific skin eruption: Secondary | ICD-10-CM | POA: Insufficient documentation

## 2022-05-27 DIAGNOSIS — R1031 Right lower quadrant pain: Secondary | ICD-10-CM | POA: Diagnosis present

## 2022-05-27 MED ORDER — DOXYCYCLINE HYCLATE 100 MG PO CAPS: 100.0000 mg | ORAL_CAPSULE | Freq: Two times a day (BID) | ORAL | 0 refills | Status: AC

## 2022-05-27 NOTE — ED Triage Notes (Signed)
Pt reports abcess right lower abdomen and right lower leg, painful, red, No drainage to area.  Taking antibiotics as prescribed.  States some improvement but remains painful

## 2022-05-27 NOTE — ED Notes (Signed)
ED Provider at bedside. 

## 2022-05-27 NOTE — Discharge Instructions (Signed)
Your history and exam today are consistent with cellulitis causing the redness and discomfort.  We did a bedside ultrasound and did not see evidence of acute abscess and after our shared decision-making conversation offering this incision and drainage attempt, you prefer to switch to an antibiotic that has worked better for you in the past.  Please take the new antibiotic and follow-up with your primary doctor.  If any symptoms are to change or worsen including worsened discomfort or bulging and not improving in several days, please return to the nearest emergency department.

## 2022-05-27 NOTE — ED Provider Notes (Addendum)
[facility] Provider Note   CSN: [id number] Arrival date & time: 05/27/22  1275     History  Chief Complaint  Patient presents with   Abscess    Tony Patel. is a 56 y.o. male.  The history is provided by the patient and medical records. No language interpreter was used.  Rash Location:  Torso and leg Torso rash location:  Abd RLQ Leg rash location:  R lower leg Quality: painful and redness   Pain details:    Quality:  Aching   Severity:  Moderate   Onset quality:  Gradual   Duration:  1 week   Timing:  Constant   Progression:  Unchanged Severity:  Moderate Onset quality:  Gradual Timing:  Constant Relieved by:  Nothing Worsened by:  Nothing Ineffective treatments: keflex. Associated symptoms: no abdominal pain, no diarrhea, no fever, no joint pain, no myalgias, no nausea, no shortness of breath, no sore throat and not vomiting        Home Medications Prior to Admission medications   Medication Sig Start Date End Date Taking? Authorizing Provider  cephALEXin (KEFLEX) 500 MG capsule Take 1 capsule (500 mg total) by mouth 4 (four) times daily. 05/24/22   Redwine, Madison A, PA-C  doxycycline (VIBRAMYCIN) 100 MG capsule Take 1 capsule (100 mg total) by mouth 2 (two) times daily. 02/23/22   Theron Arista, PA-C  HYDROcodone-acetaminophen (NORCO) 10-325 MG tablet Take 1 tablet by mouth every 6 (six) hours. 09/16/20   [provider]  ibuprofen (ADVIL) 800 MG tablet Take 1 tablet (800 mg total) by mouth 2 (two) times daily. 12/24/19   [name] A, PA-C  JANUVIA 50 MG tablet Take 50 mg by mouth daily. 12/07/19   [provider]  meloxicam (MOBIC) 7.5 MG tablet Take 7.5 mg by mouth daily. 08/23/20   [provider]  METFORMIN HCL PO Take 1,000 mg by mouth 2 (two) times daily.     [provider]  mupirocin nasal ointment (BACTROBAN) 2 % Apply in each nostril using Qtip twice a day for 5 days 12/22/20    [name], MD  rosuvastatin (CRESTOR) 20 MG tablet Take 20 mg by mouth daily. 09/27/20   [provider]      Allergies    Patient has no known allergies.    Review of Systems   Review of Systems  Constitutional:  Negative for chills and fever.  HENT:  Negative for ear pain and sore throat.   Eyes:  Negative for pain and visual disturbance.  Respiratory:  Negative for cough and shortness of breath.   Cardiovascular:  Negative for chest pain and palpitations.  Gastrointestinal:  Negative for abdominal pain, diarrhea, nausea and vomiting.  Genitourinary:  Negative for dysuria and hematuria.  Musculoskeletal:  Negative for arthralgias, back pain and myalgias.  Skin:  Positive for rash. Negative for color change and wound.  Neurological:  Negative for seizures and syncope.  Psychiatric/Behavioral:  Negative for agitation.   All other systems reviewed and are negative.   Physical Exam Updated Vital Signs BP (!) 131/93   Pulse 60   Temp 97.9 F (36.6 C) (Oral)   Resp 16   SpO2 96%  Physical Exam Vitals and nursing note reviewed.  Constitutional:      General: He is not in acute distress.    Appearance: He is well-developed. He is not ill-appearing, toxic-appearing or diaphoretic.  HENT:     Head: Normocephalic and atraumatic.  Mouth/Throat:     Mouth: Mucous membranes are moist.  Eyes:     Conjunctiva/sclera: Conjunctivae normal.  Cardiovascular:     Rate and Rhythm: Normal rate and regular rhythm.     Heart sounds: No murmur heard. Pulmonary:     Effort: Pulmonary effort is normal. No respiratory distress.     Breath sounds: Normal breath sounds. No wheezing, rhonchi or rales.  Chest:     Chest wall: No tenderness.  Abdominal:     Palpations: Abdomen is soft.     Tenderness: There is no abdominal tenderness. There is no guarding or rebound.  Musculoskeletal:        General: No swelling or tenderness.     Cervical back: Neck supple. No tenderness.      Right lower leg: No edema.     Left lower leg: No edema.  Skin:    General: Skin is warm and dry.     Capillary Refill: Capillary refill takes less than 2 seconds.     Findings: Erythema present. No signs of injury.     Comments: 2 small areas of erythema 1 in the right lower abdomen and one in his right shin.  No fluctuance.  Bedside ultrasound did not show evidence of abscess.  Cobblestoning seen consistent with cellulitis.  No drainage.  Neurological:     General: No focal deficit present.     Mental Status: He is alert.  Psychiatric:        Mood and Affect: Mood normal.     ED Results / Procedures / Treatments   Labs (all labs ordered are listed, but only abnormal results are displayed) Labs Reviewed - No data to display  EKG None  Radiology No results found.  Procedures Procedures   EMERGENCY DEPARTMENT US SOFT TISSUE INTERPRETATION "Study: Limited Soft Tissue Ultrasound"  INDICATIONS: Soft tissue infection Multiple views of the body part were obtained in real-time with a multi-frequency linear probe  PERFORMED BY: Myself IMAGES ARCHIVED?: No SIDE:Right  BODY PART:Abdominal wall INTERPRETATION:  No abcess noted and Cellulitis present    Medications Ordered in ED Medications - No data to display  ED Course/ Medical Decision Making/ A&P                           Medical Decision Making Risk Prescription drug management.    Tony SaxonCurtis Roswell Jr. is a 56 y.o. male with a past medical history significant for previous cellulitis who presents with 2 areas of redness on his body.  He reports that he is on Keflex for the last few days without significant relief with a small area of redness on his right lower abdomen.  He also reports a spot on his right shin that is concerning for infection.  He reports no fevers, chills, Jassen, cough, nausea, vomiting, constipation, or diarrhea.  He does report some mild pain there.  He reports he does not think the antibiotics  are working and wants to be switched to a different antibiotic.  On exam, he does have a 2 cm area of erythema and tenderness in his right lower abdomen near his belt line.  Bowel sounds were appreciated.  He has a small area of redness on his right shin that is also mildly tender with no drainage or bleeding.  Intact sensation, strength, and pulses in extremities.  Rest of exam unremarkable.  No other rashes seen.  Point-of-care ultrasound was obtained and did not show evidence  of abscess.  Did show some cobblestoning concerning for cellulitis.  Clinically I suspect that patient has cellulitis.  We discussed that there could be a small amount of abscess underlying that is not seen on ultrasound that we could perform an incision and drainage on but patient would rather switch to the doxycycline which she reports worked better in the past for him.  He understands that even with doxycycline and abscess could develop that would need incision and drainage but he would rather do the antibiotics rather than procedure at this time.  We will give prescription for doxycycline and he will follow-up with his PCP.  He understands return precautions and follow-up instructions and was discharged in good condition.           Final Clinical Impression(s) / ED Diagnoses Final diagnoses:  Cellulitis of abdominal wall    Rx / DC Orders ED Discharge Orders          Ordered    doxycycline (VIBRAMYCIN) 100 MG capsule  2 times daily        05/27/22 0943            Clinical Impression: 1. Cellulitis of abdominal wall     Disposition: Discharge  Condition: Good  I have discussed the results, Dx and Tx plan with the pt(& family if present). He/she/they expressed understanding and agree(s) with the plan. Discharge instructions discussed at great length. Strict return precautions discussed and pt &/or family have verbalized understanding of the instructions. No further questions at time of  discharge.    Discharge Medication List as of 05/27/2022  9:44 AM     START taking these medications   Details  !! doxycycline (VIBRAMYCIN) 100 MG capsule Take 1 capsule (100 mg total) by mouth 2 (two) times daily for 10 days., Starting Sun 05/27/2022, Until Wed 06/06/2022, Print     !! - Potential duplicate medications found. Please discuss with provider.      Follow Up: Inez Catalina, PA-C [address] [place] [address] [phone]     [facility] [id number] [place] [address] [phone]       Avonte Sensabaugh, Canary Brim, MD 05/27/22 1553    Zerick Prevette, Canary Brim, MD 05/27/22 [phone]

## 2022-06-07 ENCOUNTER — Encounter (HOSPITAL_BASED_OUTPATIENT_CLINIC_OR_DEPARTMENT_OTHER): Payer: Self-pay | Admitting: Pediatrics

## 2022-06-07 ENCOUNTER — Emergency Department (HOSPITAL_BASED_OUTPATIENT_CLINIC_OR_DEPARTMENT_OTHER)
Admission: EM | Admit: 2022-06-07 | Discharge: 2022-06-07 | Disposition: A | Discharge status: Home / Self Care | Payer: Medicaid Other | Attending: Emergency Medicine | Admitting: Emergency Medicine

## 2022-06-07 ENCOUNTER — Other Ambulatory Visit: Payer: Self-pay

## 2022-06-07 DIAGNOSIS — Z4800 Encounter for change or removal of nonsurgical wound dressing: Secondary | ICD-10-CM | POA: Insufficient documentation

## 2022-06-07 DIAGNOSIS — Z5189 Encounter for other specified aftercare: Secondary | ICD-10-CM

## 2022-06-07 NOTE — ED Provider Notes (Signed)
[facility] Provider Note   CSN: [id number] Arrival date & time: 06/07/22  1121     History  Chief Complaint  Patient presents with   Abscess    Tony Patel. is a 56 y.o. male presenting to the ED with a chief complaint of continued possible abscess.  States that he has completed a 2-week course of antibiotics for his cellulitis on his right lower abdominal wall area.  States that the area has significantly improved but he would like to have it rechecked due to continuing to have induration.  Denies any fever.  Denies any drainage   Abscess Associated symptoms: no fever and no vomiting        Home Medications Prior to Admission medications   Medication Sig Start Date End Date Taking? Authorizing Provider  cephALEXin (KEFLEX) 500 MG capsule Take 1 capsule (500 mg total) by mouth 4 (four) times daily. 05/24/22   Redwine, Madison A, PA-C  doxycycline (VIBRAMYCIN) 100 MG capsule Take 1 capsule (100 mg total) by mouth 2 (two) times daily. 02/23/22   Theron Arista, PA-C  HYDROcodone-acetaminophen (NORCO) 10-325 MG tablet Take 1 tablet by mouth every 6 (six) hours. 09/16/20   [provider]  ibuprofen (ADVIL) 800 MG tablet Take 1 tablet (800 mg total) by mouth 2 (two) times daily. 12/24/19   [name] A, PA-C  JANUVIA 50 MG tablet Take 50 mg by mouth daily. 12/07/19   [provider]  meloxicam (MOBIC) 7.5 MG tablet Take 7.5 mg by mouth daily. 08/23/20   [provider]  METFORMIN HCL PO Take 1,000 mg by mouth 2 (two) times daily.     [provider]  mupirocin nasal ointment (BACTROBAN) 2 % Apply in each nostril using Qtip twice a day for 5 days 12/22/20   [name], MD  rosuvastatin (CRESTOR) 20 MG tablet Take 20 mg by mouth daily. 09/27/20   [provider]      Allergies    Patient has no known allergies.    Review of Systems   Review of Systems  Constitutional:  Negative for fever.   Gastrointestinal:  Negative for vomiting.  Skin:  Negative for wound.    Physical Exam Updated Vital Signs BP 119/81 (BP Location: Right Arm)   Pulse 75   Temp 98.1 F (36.7 C) (Oral)   Resp 18   Ht 5\' 10"  (1.778 m)   Wt 90.7 kg   SpO2 97%   BMI 28.70 kg/m  Physical Exam Vitals and nursing note reviewed.  Constitutional:      General: He is not in acute distress.    Appearance: He is well-developed. He is not diaphoretic.  HENT:     Head: Normocephalic and atraumatic.  Eyes:     General: No scleral icterus.    Conjunctiva/sclera: Conjunctivae normal.  Pulmonary:     Effort: Pulmonary effort is normal. No respiratory distress.  Musculoskeletal:     Cervical back: Normal range of motion.  Skin:    Findings: No rash.     Comments: 2 cm area of skin thickening noted on the right lower abdominal wall without tenderness, fluctuance, warmth noted.  Neurological:     Mental Status: He is alert.     ED Results / Procedures / Treatments   Labs (all labs ordered are listed, but only abnormal results are displayed) Labs Reviewed - No data to display  EKG None  Radiology No results found.  Procedures Procedures  Medications Ordered in ED Medications - No data to display  ED Course/ Medical Decision Making/ A&P                           Medical Decision Making  56 year old male presenting to the ED for recheck of cellulitis/abscess on right lower abdominal wall.  Completed a course of antibiotics yesterday.  On exam there is no evidence of continued cellulitis and no fluctuance that would concern me for abscess.  This area is nontender.  There is no drainage noted.  Bedside ultrasound revealed no evidence of abscess.  I suspect this is indurated from prior infection.  I do not feel that he needs to be an additional course of antibiotics.  Advised patient that this should improve over time.  He remains hemodynamically stable.  Return precautions given.  Patient is  hemodynamically stable, in NAD, and able to ambulate in the ED. Evaluation does not show pathology that would require ongoing emergent intervention or inpatient treatment. I explained the diagnosis to the patient. Pain has been managed and has no complaints prior to discharge. Patient is comfortable with above plan and is stable for discharge at this time. All questions were answered prior to disposition. Strict return precautions for returning to the ED were discussed. Encouraged follow up with PCP.   An After Visit Summary was printed and given to the patient.   Portions of this note were generated with Scientist, clinical (histocompatibility and immunogenetics). Dictation errors may occur despite best attempts at proofreading.         Final Clinical Impression(s) / ED Diagnoses Final diagnoses:  Visit for wound check    Rx / DC Orders ED Discharge Orders     None         Dietrich Pates, PA-C 06/07/22 1327    [name], MD 06/14/22 [phone]

## 2022-06-07 NOTE — ED Triage Notes (Signed)
Reported finished a 14 day course of abx yesterday for abscess on right lower abdominal area and reports the size is significantly smaller; concern for the abscess still there; reports hx of DM;

## 2022-06-07 NOTE — Discharge Instructions (Addendum)
Return to the ER if you start to experience worsening pain, swelling, drainage from the area or fever.

## 2022-06-28 ENCOUNTER — Other Ambulatory Visit: Payer: Self-pay

## 2022-06-28 ENCOUNTER — Emergency Department (HOSPITAL_BASED_OUTPATIENT_CLINIC_OR_DEPARTMENT_OTHER)
Admission: EM | Admit: 2022-06-28 | Discharge: 2022-06-28 | Disposition: A | Discharge status: Home / Self Care | Payer: Medicaid Other | Attending: Emergency Medicine | Admitting: Emergency Medicine

## 2022-06-28 ENCOUNTER — Encounter (HOSPITAL_BASED_OUTPATIENT_CLINIC_OR_DEPARTMENT_OTHER): Payer: Self-pay | Admitting: Emergency Medicine

## 2022-06-28 DIAGNOSIS — L03115 Cellulitis of right lower limb: Secondary | ICD-10-CM | POA: Diagnosis not present

## 2022-06-28 DIAGNOSIS — Z7984 Long term (current) use of oral hypoglycemic drugs: Secondary | ICD-10-CM | POA: Diagnosis not present

## 2022-06-28 DIAGNOSIS — L03116 Cellulitis of left lower limb: Secondary | ICD-10-CM | POA: Insufficient documentation

## 2022-06-28 DIAGNOSIS — E119 Type 2 diabetes mellitus without complications: Secondary | ICD-10-CM | POA: Insufficient documentation

## 2022-06-28 DIAGNOSIS — L03119 Cellulitis of unspecified part of limb: Secondary | ICD-10-CM

## 2022-06-28 DIAGNOSIS — L02416 Cutaneous abscess of left lower limb: Secondary | ICD-10-CM | POA: Diagnosis present

## 2022-06-28 MED ORDER — DOXYCYCLINE HYCLATE 100 MG PO CAPS: 100.0000 mg | ORAL_CAPSULE | Freq: Two times a day (BID) | ORAL | 0 refills | Status: DC

## 2022-06-28 NOTE — ED Provider Notes (Signed)
[facility] EMERGENCY DEPARTMENT Provider Note   CSN: [id number] Arrival date & time: 06/28/22  0955     History  Chief Complaint  Patient presents with   Abscess    Tony Patel. is a 56 y.o. male.  56 yo male with a history of recurrent abscess, DM, MI, and stent placements who presented to the ED with 2 new areas of possible abscesses. He reports the area of concern on his left medial leg has been present x 4 days, and the area on his right posterior thigh has been present for a "couple" days. He denies significant pain when still but reports a "pulsating" sensation when walking and reports tenderness to touch.  Patient has been treating at home by soaking in Epsom salt.  He denies fever, chills, drainage from any of the areas. He reports he does not check his blood glucose levels at home but states his most recent hgb A1C was 6.5% on Monday.        Home Medications Prior to Admission medications   Medication Sig Start Date End Date Taking? Authorizing Provider  doxycycline (VIBRAMYCIN) 100 MG capsule Take 1 capsule (100 mg total) by mouth 2 (two) times daily. 06/28/22   Jeannie Fend, PA-C  HYDROcodone-acetaminophen (NORCO) 10-325 MG tablet Take 1 tablet by mouth every 6 (six) hours. 09/16/20   [provider]  ibuprofen (ADVIL) 800 MG tablet Take 1 tablet (800 mg total) by mouth 2 (two) times daily. 12/24/19   [name] A, PA-C  JANUVIA 50 MG tablet Take 50 mg by mouth daily. 12/07/19   [provider]  meloxicam (MOBIC) 7.5 MG tablet Take 7.5 mg by mouth daily. 08/23/20   [provider]  METFORMIN HCL PO Take 1,000 mg by mouth 2 (two) times daily.     [provider]  mupirocin nasal ointment (BACTROBAN) 2 % Apply in each nostril using Qtip twice a day for 5 days 12/22/20   [name], MD  rosuvastatin (CRESTOR) 20 MG tablet Take 20 mg by mouth daily. 09/27/20   [provider]      Allergies    Patient has  no known allergies.    Review of Systems   Review of Systems Negative except as per HPI Physical Exam Updated Vital Signs BP 133/82   Pulse 70   Temp 97.7 F (36.5 C) (Oral)   Resp 16   SpO2 98%  Physical Exam Vitals and nursing note reviewed.  Constitutional:      General: He is not in acute distress.    Appearance: He is well-developed. He is not diaphoretic.  HENT:     Head: Normocephalic and atraumatic.  Cardiovascular:     Pulses: Normal pulses.  Pulmonary:     Effort: Pulmonary effort is normal.  Musculoskeletal:        General: Tenderness present. No swelling or deformity. Normal range of motion.  Skin:    General: Skin is warm and dry.     Findings: Erythema present.     Comments: Left lower leg medially, area is 3 cm x 4 cm, erythema, indurated, raised.  No fluctuance, no streaking. Right lateral thigh with area that is 3 cm x 4 cm, mildly indurated, mildly erythematous.  No streaking, no fluctuance.  Neurological:     Mental Status: He is alert and oriented to person, place, and time.     Gait: Gait normal.  Psychiatric:        Behavior:  Behavior normal.     ED Results / Procedures / Treatments   Labs (all labs ordered are listed, but only abnormal results are displayed) Labs Reviewed - No data to display  EKG None  Radiology No results found.  Procedures Procedures    Medications Ordered in ED Medications - No data to display  ED Course/ Medical Decision Making/ A&P                           Medical Decision Making Risk Prescription drug management.   56 year old male with history of recurrent abscess and diabetes presents with concern for infection to left lower leg and right thigh. Symptoms started a few days ago, thought to be related to bug bites.  Patient has been soaking in Epsom salt without improvement.  Area consistent with cellulitis, no fluctuance at this time, does not require I&D.  Recommend using pHisoDerm soap weekly, will  place on doxycycline.  Discussed concerns for resistance as patient has been on numerous antibiotics for these recurrent infections.        Final Clinical Impression(s) / ED Diagnoses Final diagnoses:  Cellulitis of lower extremity, unspecified laterality    Rx / DC Orders ED Discharge Orders          Ordered    doxycycline (VIBRAMYCIN) 100 MG capsule  2 times daily        06/28/22 1048              Jeannie Fend, PA-C 06/28/22 1056    Tegeler, Canary Brim, MD 06/28/22 1545

## 2022-06-28 NOTE — Discharge Instructions (Signed)
Recommend pHisoDerm soap once weekly. Antibiotics as prescribed.  Warm compresses to area for 20 minutes at a time.  Do not try to squeeze or pinch the area.  Recheck with your doctor.

## 2022-06-28 NOTE — ED Triage Notes (Signed)
Patient presents to ED via POV from home. Patient reports cyst/abscess to right medial calf. Ambulatory.

## 2022-09-05 ENCOUNTER — Other Ambulatory Visit: Payer: Self-pay

## 2022-09-05 ENCOUNTER — Emergency Department (HOSPITAL_BASED_OUTPATIENT_CLINIC_OR_DEPARTMENT_OTHER)
Admission: EM | Admit: 2022-09-05 | Discharge: 2022-09-05 | Disposition: A | Discharge status: Home / Self Care | Payer: Medicaid Other | Attending: Emergency Medicine | Admitting: Emergency Medicine

## 2022-09-05 ENCOUNTER — Encounter (HOSPITAL_BASED_OUTPATIENT_CLINIC_OR_DEPARTMENT_OTHER): Payer: Self-pay | Admitting: Pediatrics

## 2022-09-05 DIAGNOSIS — L0231 Cutaneous abscess of buttock: Secondary | ICD-10-CM | POA: Diagnosis present

## 2022-09-05 DIAGNOSIS — E119 Type 2 diabetes mellitus without complications: Secondary | ICD-10-CM | POA: Insufficient documentation

## 2022-09-05 DIAGNOSIS — Z7984 Long term (current) use of oral hypoglycemic drugs: Secondary | ICD-10-CM | POA: Diagnosis not present

## 2022-09-05 MED ORDER — DOXYCYCLINE HYCLATE 100 MG PO CAPS: 100.0000 mg | ORAL_CAPSULE | Freq: Two times a day (BID) | ORAL | 0 refills | Status: DC

## 2022-09-05 MED ORDER — LIDOCAINE-EPINEPHRINE (PF) 2 %-1:200000 IJ SOLN
10.0000 mL | Freq: Once | INTRAMUSCULAR | Status: AC
  Administered 2022-09-05: 10 mL
  Filled 2022-09-05: qty 20

## 2022-09-05 NOTE — ED Notes (Signed)
I&D tray at bed side along with lido.

## 2022-09-05 NOTE — ED Triage Notes (Signed)
C/O re-current boil; this one on left butt cheek started 4-5 days ago;

## 2022-09-05 NOTE — Discharge Instructions (Addendum)
You were seen in the emergency department for an abscess.  We have drained the area and cleaned it. I would like you to have the wound checked in 2-3 days. This can be done by any doctor's office, urgent care, or emergency department. This is to make sure the area hasn't closed too soon. Try to keep the area as clean and dry as possible. It is okay to let warm soapy water run over the area, but do NOT scrub the area.   I am placing you on a course of antibiotics. It is important you finish the entire course! You can take ibuprofen or tylenol as needed for pain.   Continue to monitor how you're doing and return to the ER for new or worsening symptoms.  

## 2022-09-05 NOTE — ED Provider Notes (Signed)
[facility] EMERGENCY DEPARTMENT Provider Note   CSN: [id number] Arrival date & time: 09/05/22  1308     History  Chief Complaint  Patient presents with   Recurrent Skin Infections    Tony Mainwaring. is a 56 y.o. male with history of sciatica, diabetes, and recurrent abscesses who presents the emergency department complaining of concern for an abscess on his buttocks.  States that the area has been swollen for the past several days or so.  He has had several in the past in similar areas, sometimes needing drainage and sometimes treated with antibiotics alone.  Denies any fever.  HPI     Home Medications Prior to Admission medications   Medication Sig Start Date End Date Taking? Authorizing Provider  doxycycline (VIBRAMYCIN) 100 MG capsule Take 1 capsule (100 mg total) by mouth 2 (two) times daily. 09/05/22  Yes Savahna Casados T, PA-C  HYDROcodone-acetaminophen (NORCO) 10-325 MG tablet Take 1 tablet by mouth every 6 (six) hours. 09/16/20   [provider]  ibuprofen (ADVIL) 800 MG tablet Take 1 tablet (800 mg total) by mouth 2 (two) times daily. 12/24/19   [name] A, PA-C  JANUVIA 50 MG tablet Take 50 mg by mouth daily. 12/07/19   [provider]  meloxicam (MOBIC) 7.5 MG tablet Take 7.5 mg by mouth daily. 08/23/20   [provider]  METFORMIN HCL PO Take 1,000 mg by mouth 2 (two) times daily.     [provider]  mupirocin nasal ointment (BACTROBAN) 2 % Apply in each nostril using Qtip twice a day for 5 days 12/22/20   [name], MD  rosuvastatin (CRESTOR) 20 MG tablet Take 20 mg by mouth daily. 09/27/20   [provider]      Allergies    Patient has no known allergies.    Review of Systems   Review of Systems  Constitutional:  Negative for chills and fever.  Skin:        Abscess  All other systems reviewed and are negative.   Physical Exam Updated Vital Signs BP 118/85 (BP Location: Right Arm)    Pulse 80   Temp 98 F (36.7 C) (Oral)   Resp 18   Ht 5\' 10"  (1.778 m)   Wt 90.7 kg   SpO2 99%   BMI 28.70 kg/m  Physical Exam Vitals and nursing note reviewed. Exam conducted with a chaperone present.  Constitutional:      Appearance: Normal appearance.  HENT:     Head: Normocephalic and atraumatic.  Eyes:     Conjunctiva/sclera: Conjunctivae normal.  Pulmonary:     Effort: Pulmonary effort is normal. No respiratory distress.  Genitourinary:    Comments: Approximately 4 cm area of erythema and induration over the left buttocks, without gluteal cleft/rectal involvement. Small scabbing and some overlying fluctuance.  Skin:    General: Skin is warm and dry.  Neurological:     Mental Status: He is alert.  Psychiatric:        Mood and Affect: Mood normal.        Behavior: Behavior normal.     ED Results / Procedures / Treatments   Labs (all labs ordered are listed, but only abnormal results are displayed) Labs Reviewed - No data to display  EKG None  Radiology No results found.  Procedures . Incision and Drainage  Date/Time: 09/05/2022 4:22 PM  Performed by: 09/07/2022, PA-C Authorized by: Su Monks, PA-C   Consent:  Consent obtained:  Verbal   Consent given by:  Patient   Risks, benefits, and alternatives were discussed: yes     Risks discussed:  Bleeding, incomplete drainage and pain Universal protocol:    Procedure explained and questions answered to patient or proxy's satisfaction: yes     Patient identity confirmed:  Verbally with patient Location:    Type:  Abscess   Size:  4 cm   Location: buttocks. Pre-procedure details:    Skin preparation:  Povidone-iodine Sedation:    Sedation type:  None Anesthesia:    Anesthesia method:  Local infiltration   Local anesthetic:  Lidocaine 2% WITH epi Procedure type:    Complexity:  Simple Procedure details:    Needle aspiration: yes     Needle size:  25 G   Incision types:  Single  straight   Wound management:  Probed and deloculated and irrigated with saline   Drainage:  Bloody and purulent   Drainage amount:  Scant   Wound treatment:  Wound left open   Packing materials:  None Post-procedure details:    Procedure completion:  Tolerated well, no immediate complications     Medications Ordered in ED Medications  lidocaine-EPINEPHrine (XYLOCAINE W/EPI) 2 %-1:200000 (PF) injection 10 mL (10 mLs Infiltration Given 09/05/22 1418)    ED Course/ Medical Decision Making/ A&P                           Medical Decision Making Risk Prescription drug management.   Patient is a 56 year old male who presents to the emergency department for an abscess.  Physical exam: 4 cm area of erythema and induration over the left buttocks  Procedure: Abscess was incised and drained. Area was anesthestized with lidocaine with epinephrine. Patient tolerated procedure well with no immediate complications.   Imaging: Soft tissue ultrasound performed post-procedure by attending physician Dr Oswald Hillock as documented separately. No abscess present, some cellulitic findings.  Disposition: Patient is not requiring admission or inpatient treatment further symptoms.  Patient was placed on course of antibiotics and instructed to have a wound check in 3 days.  We discussed reasons to return to the emergency department, and patient is agreeable to the plan.  Final Clinical Impression(s) / ED Diagnoses Final diagnoses:  Cutaneous abscess of buttock    Rx / DC Orders ED Discharge Orders          Ordered    doxycycline (VIBRAMYCIN) 100 MG capsule  2 times daily        09/05/22 1526           Portions of this report may have been transcribed using voice recognition software. Every effort was made to ensure accuracy; however, inadvertent computerized transcription errors may be present.    Estill Cotta 09/05/22 1624    Dorie Rank, MD 09/06/22 [phone]

## 2022-09-05 NOTE — ED Provider Notes (Addendum)
Procedures Ultrasound ED Soft Tissue  Date/Time: 09/05/2022 3:30 PM  Performed by: Tretha Sciara, MD Authorized by: Tretha Sciara, MD   Procedure details:    Indications: localization of abscess and evaluate for cellulitis     Transverse view:  Visualized   Longitudinal view:  Visualized   Images: archived   Location:    Location: buttocks     Side:  Left Findings:     no abscess present    cellulitis present    no foreign body present        Tretha Sciara, MD 09/05/22 1531

## 2022-10-16 ENCOUNTER — Encounter (HOSPITAL_BASED_OUTPATIENT_CLINIC_OR_DEPARTMENT_OTHER): Payer: Self-pay | Admitting: Emergency Medicine

## 2022-10-16 ENCOUNTER — Emergency Department (HOSPITAL_BASED_OUTPATIENT_CLINIC_OR_DEPARTMENT_OTHER)
Admission: EM | Admit: 2022-10-16 | Discharge: 2022-10-16 | Disposition: A | Discharge status: Home / Self Care | Payer: Medicaid Other | Attending: Emergency Medicine | Admitting: Emergency Medicine

## 2022-10-16 DIAGNOSIS — Z7984 Long term (current) use of oral hypoglycemic drugs: Secondary | ICD-10-CM | POA: Insufficient documentation

## 2022-10-16 DIAGNOSIS — E119 Type 2 diabetes mellitus without complications: Secondary | ICD-10-CM | POA: Diagnosis not present

## 2022-10-16 DIAGNOSIS — L03317 Cellulitis of buttock: Secondary | ICD-10-CM | POA: Diagnosis present

## 2022-10-16 MED ORDER — DOXYCYCLINE HYCLATE 100 MG PO CAPS: 100.0000 mg | ORAL_CAPSULE | Freq: Two times a day (BID) | ORAL | 0 refills | Status: AC

## 2022-10-16 NOTE — Discharge Instructions (Signed)
Warm compresses 4 times daily, take antibiotics for the entire course.  Follow-up with dermatology Please take all of your antibiotics until finished!   You may develop abdominal discomfort or diarrhea from the antibiotic.  You may help offset this with probiotics which you can buy or get in yogurt. Do not eat  or take the probiotics until 2 hours after your antibiotic.    Please use Tylenol or ibuprofen for pain.  You may use 600 mg ibuprofen every 6 hours or 1000 mg of Tylenol every 6 hours.  You may choose to alternate between the 2.  This would be most effective.  Not to exceed 4 g of Tylenol within 24 hours.  Not to exceed 3200 mg ibuprofen 24 hours.

## 2022-10-16 NOTE — ED Provider Notes (Signed)
[place] EMERGENCY DEPARTMENT Provider Note   CSN: [id number] Arrival date & time: 10/16/22  1257     History  Chief Complaint  Patient presents with   Abscess    Tony Patel. is a 56 y.o. male.   Abscess  Patient is a 56 year old male with past medical history significant for DM 2, abscesses  He states he is presented to the emergency room today with pain in the left buttocks he states that it first began bothering him approximately 2 days ago.  He has a history of abscesses and states he has had to have several drained before.  He states he decided he wanted to come in early before this got as bad as it has in the past.  He has not had any fevers nausea vomiting he has not experienced any fatigue lightheadedness or dizziness.  He states he feels generally well apart from pain on the left buttocks       Home Medications Prior to Admission medications   Medication Sig Start Date End Date Taking? Authorizing Provider  doxycycline (VIBRAMYCIN) 100 MG capsule Take 1 capsule (100 mg total) by mouth 2 (two) times daily for 7 days. 10/16/22 10/23/22  Tedd Sias, PA  HYDROcodone-acetaminophen (NORCO) 10-325 MG tablet Take 1 tablet by mouth every 6 (six) hours. 09/16/20   [provider]  ibuprofen (ADVIL) 800 MG tablet Take 1 tablet (800 mg total) by mouth 2 (two) times daily. 12/24/19   Nuala Alpha A, PA-C  JANUVIA 50 MG tablet Take 50 mg by mouth daily. 12/07/19   [provider]  meloxicam (MOBIC) 7.5 MG tablet Take 7.5 mg by mouth daily. 08/23/20   [provider]  METFORMIN HCL PO Take 1,000 mg by mouth 2 (two) times daily.     [provider]  mupirocin nasal ointment (BACTROBAN) 2 % Apply in each nostril using Qtip twice a day for 5 days 12/22/20   Truddie Hidden, MD  rosuvastatin (CRESTOR) 20 MG tablet Take 20 mg by mouth daily. 09/27/20   [provider]      Allergies    Patient has no known allergies.     Review of Systems   Review of Systems  Physical Exam Updated Vital Signs BP (!) 138/92 (BP Location: Left Arm)   Pulse 77   Temp 97.8 F (36.6 C) (Oral)   Resp 18   Ht 5\' 10"  (1.778 m)   Wt 93 kg   SpO2 97%   BMI 29.41 kg/m  Physical Exam Vitals and nursing note reviewed.  Constitutional:      General: He is not in acute distress.    Appearance: Normal appearance. He is not ill-appearing.  HENT:     Head: Normocephalic and atraumatic.  Eyes:     General: No scleral icterus.       Right eye: No discharge.        Left eye: No discharge.     Conjunctiva/sclera: Conjunctivae normal.  Pulmonary:     Effort: Pulmonary effort is normal.     Breath sounds: No stridor.  Skin:    General: Skin is warm and dry.     Comments: Left buttocks with approximately 5 x 4 cm area of cellulitis.  There is some indurated tissue, no fluctuance and no purulent drainage.  This area is located on the part of the left buttocks that is in contact with a sitting surface  Neurological:     Mental Status:  He is alert and oriented to person, place, and time. Mental status is at baseline.     ED Results / Procedures / Treatments   Labs (all labs ordered are listed, but only abnormal results are displayed) Labs Reviewed - No data to display  EKG None  Radiology No results found.  Procedures Procedures    Medications Ordered in ED Medications - No data to display  ED Course/ Medical Decision Making/ A&P                           Medical Decision Making  Patient is a 56 year old male with past medical history significant for DM 2, abscesses  He states he is presented to the emergency room today with pain in the left buttocks he states that it first began bothering him approximately 2 days ago.  He has a history of abscesses and states he has had to have several drained before.  He states he decided he wanted to come in early before this got as bad as it has in the past.  He has not had  any fevers nausea vomiting he has not experienced any fatigue lightheadedness or dizziness.  He states he feels generally well apart from pain on the left buttocks   Physical exam notable for area of cellulitis.  No fluctuance.  There is some indurated tissue here.  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  Since he has had numerous of these in the past I recommended he follow-up with dermatology.  Return precautions discussed.  Vital signs within normal limits.  Patient agreeable to plan.  Warm compresses and follow-up with dermatology.  Final Clinical Impression(s) / ED Diagnoses Final diagnoses:  Cellulitis of buttock    Rx / DC Orders ED Discharge Orders          Ordered    doxycycline (VIBRAMYCIN) 100 MG capsule  2 times daily        10/16/22 1334              Solon Augusta [place], [place] 10/16/22 1336    [name], MD 10/16/22 1442

## 2022-10-16 NOTE — ED Triage Notes (Signed)
Abscess on left buttocks x 2 days.

## 2022-11-18 ENCOUNTER — Encounter (HOSPITAL_BASED_OUTPATIENT_CLINIC_OR_DEPARTMENT_OTHER): Payer: Self-pay | Admitting: Emergency Medicine

## 2022-11-18 ENCOUNTER — Emergency Department (HOSPITAL_BASED_OUTPATIENT_CLINIC_OR_DEPARTMENT_OTHER)
Admission: EM | Admit: 2022-11-18 | Discharge: 2022-11-18 | Disposition: A | Discharge status: Home / Self Care | Payer: Medicaid Other | Attending: Emergency Medicine | Admitting: Emergency Medicine

## 2022-11-18 ENCOUNTER — Other Ambulatory Visit: Payer: Self-pay

## 2022-11-18 DIAGNOSIS — H60591 Other noninfective acute otitis externa, right ear: Secondary | ICD-10-CM | POA: Insufficient documentation

## 2022-11-18 DIAGNOSIS — Z7984 Long term (current) use of oral hypoglycemic drugs: Secondary | ICD-10-CM | POA: Insufficient documentation

## 2022-11-18 DIAGNOSIS — E119 Type 2 diabetes mellitus without complications: Secondary | ICD-10-CM | POA: Insufficient documentation

## 2022-11-18 DIAGNOSIS — I10 Essential (primary) hypertension: Secondary | ICD-10-CM | POA: Insufficient documentation

## 2022-11-18 DIAGNOSIS — I2510 Atherosclerotic heart disease of native coronary artery without angina pectoris: Secondary | ICD-10-CM | POA: Insufficient documentation

## 2022-11-18 DIAGNOSIS — L02413 Cutaneous abscess of right upper limb: Secondary | ICD-10-CM | POA: Insufficient documentation

## 2022-11-18 DIAGNOSIS — H60501 Unspecified acute noninfective otitis externa, right ear: Secondary | ICD-10-CM

## 2022-11-18 DIAGNOSIS — Z79899 Other long term (current) drug therapy: Secondary | ICD-10-CM | POA: Insufficient documentation

## 2022-11-18 MED ORDER — OFLOXACIN 0.3 % OT SOLN: 5.0000 [drp] | Freq: Two times a day (BID) | OTIC | 0 refills | Status: AC

## 2022-11-18 MED ORDER — DOXYCYCLINE HYCLATE 100 MG PO CAPS: 100.0000 mg | ORAL_CAPSULE | Freq: Two times a day (BID) | ORAL | 0 refills | Status: AC

## 2022-11-18 NOTE — Discharge Instructions (Addendum)
Thank you for coming to [facility] Emergency Department. You were seen for abscesses and ear drainage. We did an exam,  and these showed an abscess as well as acute otitis externa, an infection of the ear canal.  We will prescribe 7 days of doxycycline an antibiotic to take twice per day as well as 7 days of ofloxacin eardrops to place 5 drops in the right ear 2 times daily. Please follow up with your primary care provider within 1 week.   Do not hesitate to return to the ED or call 911 if you experience: -Worsening symptoms -Lightheadedness, passing out -Fevers/chills -Anything else that concerns you

## 2022-11-18 NOTE — ED Provider Notes (Signed)
[facility] EMERGENCY DEPARTMENT Provider Note   CSN: [id number] Arrival date & time: 11/18/22  7124     History  Chief Complaint  Patient presents with   Abscess    Tony Demir. is a 56 y.o. male with T2DM, HTN, HLD, CAD w/ STEMI of RCA s/p PCI 2022, DDD, tobacco use, polysubstance use presents with multiple complaints.   Presents with a few days of "bump" on R wrist that patient states is an abscess, had been draining purulence and was very painful but not as painful now as it had been, though is still red and inflamed. Also with a developing abscess on his R buttock that patient has had before. States he wanted to come before it got so severe. Also with a few days of pain and drainage from his R ear canal. No changes in hearing, no f/c, no systemic symptoms like nausea/vomiting.   HPI     Home Medications Prior to Admission medications   Medication Sig Start Date End Date Taking? Authorizing Provider  doxycycline (VIBRAMYCIN) 100 MG capsule Take 1 capsule (100 mg total) by mouth 2 (two) times daily for 7 days. 11/18/22 11/25/22 Yes Loetta Rough, MD  ofloxacin (FLOXIN) 0.3 % OTIC solution Place 5 drops into the right ear 2 (two) times daily for 7 days. 11/18/22 11/25/22 Yes Loetta Rough, MD  HYDROcodone-acetaminophen (NORCO) 10-325 MG tablet Take 1 tablet by mouth every 6 (six) hours. 09/16/20   [provider]  ibuprofen (ADVIL) 800 MG tablet Take 1 tablet (800 mg total) by mouth 2 (two) times daily. 12/24/19   [name] A, PA-C  JANUVIA 50 MG tablet Take 50 mg by mouth daily. 12/07/19   [provider]  meloxicam (MOBIC) 7.5 MG tablet Take 7.5 mg by mouth daily. 08/23/20   [provider]  METFORMIN HCL PO Take 1,000 mg by mouth 2 (two) times daily.     [provider]  mupirocin nasal ointment (BACTROBAN) 2 % Apply in each nostril using Qtip twice a day for 5 days 12/22/20   [name], MD  rosuvastatin (CRESTOR) 20  MG tablet Take 20 mg by mouth daily. 09/27/20   [provider]      Allergies    Patient has no known allergies.    Review of Systems   Review of Systems Review of systems Negative for f/c.  A 10 point review of systems was performed and is negative unless otherwise reported in HPI.  Physical Exam Updated Vital Signs BP 109/81   Pulse 64   Temp 97.8 F (36.6 C) (Oral)   Resp 18   SpO2 95%  Physical Exam General: Normal appearing male, lying in bed.  HEENT: PERRLA, Sclera anicteric, MMM, trachea midline. Inflamed R ear canal. Normal appearing R external auricle w/ pain with manipulation. No redness/swelling over R mastoid process. Unable to see the tympanic membrane due to purulence and ear wax on the R. Normal appearing L TM/ear canal/auricle. Cardiology: RRR, no murmurs/rubs/gallops. BL radial and DP pulses equal bilaterally.  Resp: Normal respiratory rate and effort. CTAB, no wheezes, rhonchi, crackles.  Abd: Soft, non-tender, non-distended. No rebound tenderness or guarding.  GU: Left buttock with small 1 x 1 cm area of mild erythema and tenderness palpation. Not near the anus/rectum area.  MSK: 3x3 cm raised erythematous area of induration/fluctuance of the R ulnar wrist.  Overlying scab with draining purulence around the edges of the distal end. No diffuse swelling of  joint. No pain with passive or active ROM of the wrist.  No peripheral edema or signs of trauma. Extremities without deformity or TTP. No cyanosis or clubbing. Skin: warm, dry. No rashes or lesions. Back: No CVA tenderness Neuro: A&Ox4, CNs II-XII grossly intact. MAEs. Sensation grossly intact.  Psych: Normal mood and affect.   ED Results / Procedures / Treatments   Procedures Procedures    Medications Ordered in ED Medications - No data to display  ED Course/ Medical Decision Making/ A&P                          Medical Decision Making Risk Prescription drug management.    This patient is  HDS, non-toxic appearing. He presents to the ED for concern of abscesses and ear drainage. Exam demonstrates draining abscess of the right wrist, small developing cellulitis/abscess of the left buttock, and otitis externa of the right ear canal. Afebrile, overall very well-appearing.   MDM:    Consider cellulitis vs abscess of wrist and developing cellulitis/abscess of left buttock.  The right wrist lesion is circumscribed, does not involve the remainder of the joint, the joint itself is nontender and nonerythematous without any pain with range of motion, no concern for septic joint at this time.  Left buttock lesion is small and circumscribed, no c/f perirectal abscess.  Patient was recently treated with doxycycline which he states helped his symptoms.  I do not believe that the wrist abscess requires drainage because it is still draining purulence at this time.  Do not believe I&D is necessary.  Will treat patient's symptoms with doxycycline and instructed follow-up with PCP. Unable to visualize the R tympanic membrane, but lower c/f rupture of the membrane given the patient has reported no changes in his hearing.  Otitis externa is demonstrated on exam, no concern for malignant otitis externa with the external auricle appearing normal and no periauricular erythema/pain, no mastoid otitis on exam.  Will treat with ofloxacin drops which would be safe with tympanic membrane rupture given that I cannot confirm visually that it is not ruptured.  Considered admission but patient is stable at this time for discharge.  Instructed to follow-up with PCP within 1 week.  All questions answered to patient satisfaction.   Additional history obtained from chart review.   Social Determinants of Health: lives independently  Disposition:  DC w/ doxycycline, ofloxacin drops, discharge instructions/return precautions  Co morbidities that complicate the patient evaluation  Past Medical History:  Diagnosis Date    Abscess    Back pain    Diabetes mellitus without complication (HCC)    Myocardial infarct (HCC)    Sciatica      Medicines Meds ordered this encounter  Medications   ofloxacin (FLOXIN) 0.3 % OTIC solution    Sig: Place 5 drops into the right ear 2 (two) times daily for 7 days.    Dispense:  5 mL    Refill:  0   doxycycline (VIBRAMYCIN) 100 MG capsule    Sig: Take 1 capsule (100 mg total) by mouth 2 (two) times daily for 7 days.    Dispense:  14 capsule    Refill:  0    I have reviewed the patients home medicines and have made adjustments as needed  Problem List / ED Course: Problem List Items Addressed This Visit   None Visit Diagnoses     Abscess of skin of right wrist    -  Primary  Acute otitis externa of right ear, unspecified type             This note was created using dictation software, which may contain spelling or grammatical errors.    Loetta Rough, MD 11/18/22 [phone]

## 2022-11-18 NOTE — ED Triage Notes (Signed)
Here for multiple complaints , abscess check up , righ wrist healing cellulitis , reports has another infection right upper posterior thigh .  Right ear drainage . Hx diabetes .

## 2023-02-27 ENCOUNTER — Encounter (HOSPITAL_BASED_OUTPATIENT_CLINIC_OR_DEPARTMENT_OTHER): Payer: Self-pay | Admitting: Emergency Medicine

## 2023-02-27 ENCOUNTER — Other Ambulatory Visit: Payer: Self-pay

## 2023-02-27 ENCOUNTER — Emergency Department (HOSPITAL_BASED_OUTPATIENT_CLINIC_OR_DEPARTMENT_OTHER)
Admission: EM | Admit: 2023-02-27 | Discharge: 2023-02-27 | Disposition: A | Discharge status: Home / Self Care | Payer: Medicaid Other | Attending: Emergency Medicine | Admitting: Emergency Medicine

## 2023-02-27 DIAGNOSIS — H6001 Abscess of right external ear: Secondary | ICD-10-CM | POA: Diagnosis present

## 2023-02-27 MED ORDER — DOXYCYCLINE HYCLATE 100 MG PO CAPS: 100.0000 mg | ORAL_CAPSULE | Freq: Two times a day (BID) | ORAL | 0 refills | Status: AC

## 2023-02-27 NOTE — ED Triage Notes (Signed)
Pt with draining wound to RT ear x 3-4d; hx of frequent abscesses/cellulitis

## 2023-02-27 NOTE — ED Provider Notes (Signed)
Tony Patel   CSN: [id number] Arrival date & time: 02/27/23  2026     History  Chief Complaint  Patient presents with   Wound Check    Tony Patel. is a 57 y.o. male presents to the ED complaining of an abscess of the right ear for the last 3 days.  He reports the abscess has been draining at home.  Reports that it has been forming a scab which he has been picking off.  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  He denies associated fever, chills, nausea, vomiting, change in hearing, or any other concerns today.  He has not recently been on any doxycycline or other antibiotics.      Home Medications Prior to Admission medications   Medication Sig Start Date End Date Taking? Authorizing Provider  doxycycline (VIBRAMYCIN) 100 MG capsule Take 1 capsule (100 mg total) by mouth 2 (two) times daily for 10 days. 02/27/23 03/09/23 Yes Dara Beidleman L, PA-C  HYDROcodone-acetaminophen (NORCO) 10-325 MG tablet Take 1 tablet by mouth every 6 (six) hours. 09/16/20   [provider]  ibuprofen (ADVIL) 800 MG tablet Take 1 tablet (800 mg total) by mouth 2 (two) times daily. 12/24/19   Nuala Alpha A, PA-C  JANUVIA 50 MG tablet Take 50 mg by mouth daily. 12/07/19   [provider]  meloxicam (MOBIC) 7.5 MG tablet Take 7.5 mg by mouth daily. 08/23/20   [provider]  METFORMIN HCL PO Take 1,000 mg by mouth 2 (two) times daily.     [provider]  mupirocin nasal ointment (BACTROBAN) 2 % Apply in each nostril using Qtip twice a day for 5 days 12/22/20   Truddie Hidden, MD  rosuvastatin (CRESTOR) 20 MG tablet Take 20 mg by mouth daily. 09/27/20   [provider]      Allergies    Patient has no known allergies.    Review of Systems   Review of Systems  All other systems reviewed and  are negative.   Physical Exam Updated Vital Signs BP 117/69 (BP Location: Right Arm)   Pulse 96   Temp 98.4 F (36.9 C) (Oral)   Resp 18   Ht 5\' 10"  (1.778 m)   Wt 90.7 kg   SpO2 97%   BMI 28.70 kg/m  Physical Exam Vitals and nursing Patel reviewed.  Constitutional:      General: He is not in acute distress.    Appearance: Normal appearance.  HENT:     Head: Normocephalic and atraumatic.     Right Ear: Tympanic membrane and ear canal normal.     Left Ear: Tympanic membrane and ear canal normal.     Ears:     Comments: No mastoid tenderness    Mouth/Throat:     Mouth: Mucous membranes are moist.  Eyes:     Conjunctiva/sclera: Conjunctivae normal.  Cardiovascular:     Rate and Rhythm: Normal rate and regular rhythm.     Heart sounds: No murmur heard. Pulmonary:     Effort: Pulmonary effort is normal.     Breath sounds: Normal breath sounds.  Abdominal:     General: Abdomen is flat.     Palpations: Abdomen is soft.     Tenderness: There is no abdominal tenderness.  Musculoskeletal:        General:  Normal range of motion.     Cervical back: Neck supple.     Right lower leg: No edema.     Left lower leg: No edema.  Skin:    General: Skin is warm and dry.     Capillary Refill: Capillary refill takes less than 2 seconds.     Comments: Small approximately 0.5 cm circular wound at the bottom of the helix of the right ear just superior to the ear lobule, scab in place overlying wound, moderate erythema and mild surrounding tenderness to palpation, no extension up the ear or to the ear canal, palpable induration under the wound but no areas of fluctuance, draining purulent fluid around the scab  Neurological:     Mental Status: He is alert. Mental status is at baseline.  Psychiatric:        Behavior: Behavior normal.     ED Results / Procedures / Treatments   Labs (all labs ordered are listed, but only abnormal results are displayed) Labs Reviewed - No data to  display  EKG None  Radiology No results found.  Procedures Procedures    Medications Ordered in ED Medications - No data to display  ED Course/ Medical Decision Making/ A&P                             Medical Decision Making Risk Prescription drug management.   Medical Decision Making:   Tony Patel. is a 57 y.o. male who presented to the ED today with abscess detailed above.    Patient's presentation is complicated by their history of recurrent abscesses.  Complete initial physical exam performed, notably the patient  was in no acute distress.  He had a small circular wound to the helix of the right ear as above that was actively draining purulent discharge.  Mild surrounding cellulitis.  No areas of fluctuance.  No mastoid tenderness.    Reviewed and confirmed nursing documentation for past medical history, family history, social history.    Initial Assessment:   Differential diagnosis includes but is not limited to abscess, cellulitis, mastoiditis, cyst, osteomyelitis, sepsis.  This is most consistent with an acute complicated illness   Clinical Impression:  1. Abscess of external ear, right      Discharge    This is a 57 year old male who presents to the ED complaining of an abscess to the right ear.  He states that it started approximately 3 days ago.  He reports that he has been picking the scab off and it has been draining at home.  He has not been using warm compresses.  He has a history of recurrent abscesses and states that he usually requires doxycycline to get rid of these.  He has not been on doxycycline or other antibiotics recently.  He has had no fever, chills, nausea, vomiting or other signs of systemic symptoms.  He has unremarkable vital signs.  On exam, he has a small circular wound to the lower helix of the right ear with an overlying scab.  There is underlying induration but no areas of fluctuance.  Area is draining purulent discharge around the  scab.  Moderate surrounding erythema but this does not extend further up the ear or into the ear canal.  No mastoid tenderness.  Appears to be consistent with an abscess likely developing a mild surrounding cellulitis.  With this, will give patient prescription for antibiotics for treatment.  Encouraged him to use  warm compresses at home to help the area drain.  Counseled him against picking at the scab as this can continue to reintroduce bacteria and worsen or prolong symptoms.  Patient expressed understanding of this.  Strict ED return precautions given, all questions answered, and stable for discharge.         Final Clinical Impression(s) / ED Diagnoses Final diagnoses:  Abscess of external ear, right    Rx / DC Orders ED Discharge Orders          Ordered    doxycycline (VIBRAMYCIN) 100 MG capsule  2 times daily        02/27/23 2133              Suzzette Righter, PA-C 02/28/23 0029    Malvin Johns, MD 03/09/23 [phone]

## 2023-02-27 NOTE — Discharge Instructions (Signed)
Thank you for letting us take care of you today.  I am treating the abscess of your ear with antibiotics to take at home.  Please take these as instructed.  Please use warm compresses as we discussed to help the abscess continues to drain.  If you do not notice significant improvement in her symptoms and/or resolution by the beginning of next week, I recommend that you follow-up with your primary care provider for reevaluation.  If you develop any new or worsening symptoms such as fever, significant worsening pain, redness that starts extending, chest pain, shortness of breath, or other concerns, please return to the nearest emergency department for reevaluation.
